# Patient Record
Sex: Female | Born: 1955
Health system: Southern US, Community
[De-identification: ages and names within clinical notes are randomized; demographics above are authoritative.]

## PROBLEM LIST (undated history)

## (undated) DIAGNOSIS — I341 Nonrheumatic mitral (valve) prolapse: Secondary | ICD-10-CM

## (undated) DIAGNOSIS — S6291XA Unspecified fracture of right wrist and hand, initial encounter for closed fracture: Secondary | ICD-10-CM

## (undated) DIAGNOSIS — K219 Gastro-esophageal reflux disease without esophagitis: Secondary | ICD-10-CM

## (undated) DIAGNOSIS — E538 Deficiency of other specified B group vitamins: Secondary | ICD-10-CM

## (undated) DIAGNOSIS — R0600 Dyspnea, unspecified: Secondary | ICD-10-CM

## (undated) DIAGNOSIS — A1801 Tuberculosis of spine: Secondary | ICD-10-CM

## (undated) DIAGNOSIS — G43909 Migraine, unspecified, not intractable, without status migrainosus: Secondary | ICD-10-CM

## (undated) DIAGNOSIS — F32A Depression, unspecified: Secondary | ICD-10-CM

## (undated) DIAGNOSIS — K589 Irritable bowel syndrome without diarrhea: Secondary | ICD-10-CM

## (undated) DIAGNOSIS — T8859XA Other complications of anesthesia, initial encounter: Secondary | ICD-10-CM

## (undated) DIAGNOSIS — I340 Nonrheumatic mitral (valve) insufficiency: Secondary | ICD-10-CM

## (undated) DIAGNOSIS — T4145XA Adverse effect of unspecified anesthetic, initial encounter: Secondary | ICD-10-CM

## (undated) DIAGNOSIS — Z973 Presence of spectacles and contact lenses: Secondary | ICD-10-CM

## (undated) DIAGNOSIS — I471 Supraventricular tachycardia, unspecified: Secondary | ICD-10-CM

## (undated) DIAGNOSIS — I951 Orthostatic hypotension: Secondary | ICD-10-CM

## (undated) DIAGNOSIS — G839 Paralytic syndrome, unspecified: Secondary | ICD-10-CM

## (undated) DIAGNOSIS — F329 Major depressive disorder, single episode, unspecified: Secondary | ICD-10-CM

## (undated) DIAGNOSIS — R5383 Other fatigue: Secondary | ICD-10-CM

## (undated) HISTORY — DX: Dyspnea, unspecified: R06.00

## (undated) HISTORY — DX: Gastro-esophageal reflux disease without esophagitis: K21.9

## (undated) HISTORY — DX: Nonrheumatic mitral (valve) prolapse: I34.1

## (undated) HISTORY — DX: Orthostatic hypotension: I95.1

## (undated) HISTORY — DX: Depression, unspecified: F32.A

## (undated) HISTORY — DX: Tuberculosis of spine: A18.01

## (undated) HISTORY — DX: Deficiency of other specified B group vitamins: E53.8

## (undated) HISTORY — DX: Nonrheumatic mitral (valve) insufficiency: I34.0

## (undated) HISTORY — DX: Other fatigue: R53.83

## (undated) HISTORY — DX: Supraventricular tachycardia: I47.1

## (undated) HISTORY — PX: COLONOSCOPY: SHX174

## (undated) HISTORY — PX: TUBAL LIGATION: SHX77

## (undated) HISTORY — DX: Irritable bowel syndrome, unspecified: K58.9

## (undated) HISTORY — DX: Paralytic syndrome, unspecified: G83.9

## (undated) HISTORY — DX: Supraventricular tachycardia, unspecified: I47.10

## (undated) HISTORY — DX: Major depressive disorder, single episode, unspecified: F32.9

## (undated) HISTORY — DX: Migraine, unspecified, not intractable, without status migrainosus: G43.909

## (undated) HISTORY — DX: Unspecified fracture of right wrist and hand, initial encounter for closed fracture: S62.91XA

---

## 1978-07-21 HISTORY — PX: OTHER SURGICAL HISTORY: SHX169

## 1983-07-22 HISTORY — PX: OTHER SURGICAL HISTORY: SHX169

## 1998-09-14 ENCOUNTER — Ambulatory Visit (HOSPITAL_COMMUNITY): Admission: RE | Admit: 1998-09-14 | Discharge: 1998-09-14 | Payer: Self-pay | Admitting: Internal Medicine

## 1998-12-09 ENCOUNTER — Emergency Department (HOSPITAL_COMMUNITY): Admission: EM | Admit: 1998-12-09 | Discharge: 1998-12-09 | Payer: Self-pay | Admitting: Emergency Medicine

## 1998-12-10 ENCOUNTER — Encounter: Payer: Self-pay | Admitting: Family Medicine

## 1998-12-10 ENCOUNTER — Ambulatory Visit (HOSPITAL_COMMUNITY): Admission: RE | Admit: 1998-12-10 | Discharge: 1998-12-10 | Payer: Self-pay | Admitting: Emergency Medicine

## 2000-02-15 ENCOUNTER — Inpatient Hospital Stay (HOSPITAL_COMMUNITY): Admission: EM | Admit: 2000-02-15 | Discharge: 2000-02-16 | Payer: Self-pay | Admitting: Emergency Medicine

## 2000-02-15 ENCOUNTER — Encounter: Payer: Self-pay | Admitting: Emergency Medicine

## 2000-03-10 ENCOUNTER — Emergency Department (HOSPITAL_COMMUNITY): Admission: EM | Admit: 2000-03-10 | Discharge: 2000-03-10 | Payer: Self-pay | Admitting: Emergency Medicine

## 2000-03-18 ENCOUNTER — Emergency Department (HOSPITAL_COMMUNITY): Admission: EM | Admit: 2000-03-18 | Discharge: 2000-03-18 | Payer: Self-pay | Admitting: Podiatry

## 2000-03-18 ENCOUNTER — Encounter: Payer: Self-pay | Admitting: *Deleted

## 2000-04-10 ENCOUNTER — Ambulatory Visit (HOSPITAL_COMMUNITY): Admission: RE | Admit: 2000-04-10 | Discharge: 2000-04-11 | Payer: Self-pay | Admitting: Internal Medicine

## 2001-02-17 ENCOUNTER — Encounter: Payer: Self-pay | Admitting: Neurology

## 2001-02-17 ENCOUNTER — Emergency Department (HOSPITAL_COMMUNITY): Admission: EM | Admit: 2001-02-17 | Discharge: 2001-02-17 | Payer: Self-pay | Admitting: Emergency Medicine

## 2001-03-17 ENCOUNTER — Encounter: Admission: RE | Admit: 2001-03-17 | Discharge: 2001-06-15 | Payer: Self-pay | Admitting: Family Medicine

## 2002-03-14 ENCOUNTER — Other Ambulatory Visit: Admission: RE | Admit: 2002-03-14 | Discharge: 2002-03-14 | Payer: Self-pay | Admitting: Gynecology

## 2003-03-22 ENCOUNTER — Other Ambulatory Visit: Admission: RE | Admit: 2003-03-22 | Discharge: 2003-03-22 | Payer: Self-pay | Admitting: Gynecology

## 2003-06-28 ENCOUNTER — Ambulatory Visit (HOSPITAL_COMMUNITY): Admission: RE | Admit: 2003-06-28 | Discharge: 2003-06-28 | Payer: Self-pay | Admitting: Gastroenterology

## 2003-06-28 ENCOUNTER — Encounter (INDEPENDENT_AMBULATORY_CARE_PROVIDER_SITE_OTHER): Payer: Self-pay | Admitting: *Deleted

## 2003-08-08 ENCOUNTER — Encounter: Admission: RE | Admit: 2003-08-08 | Discharge: 2003-09-26 | Payer: Self-pay | Admitting: Family Medicine

## 2004-04-18 ENCOUNTER — Other Ambulatory Visit: Admission: RE | Admit: 2004-04-18 | Discharge: 2004-04-18 | Payer: Self-pay | Admitting: Gynecology

## 2004-05-22 ENCOUNTER — Ambulatory Visit: Payer: Self-pay | Admitting: Internal Medicine

## 2004-05-28 ENCOUNTER — Ambulatory Visit: Payer: Self-pay | Admitting: Cardiology

## 2004-06-05 ENCOUNTER — Ambulatory Visit: Payer: Self-pay | Admitting: Internal Medicine

## 2004-06-10 ENCOUNTER — Ambulatory Visit: Payer: Self-pay | Admitting: Internal Medicine

## 2004-06-11 ENCOUNTER — Ambulatory Visit: Payer: Self-pay | Admitting: Internal Medicine

## 2004-06-19 ENCOUNTER — Ambulatory Visit: Payer: Self-pay | Admitting: Internal Medicine

## 2004-06-25 ENCOUNTER — Ambulatory Visit: Payer: Self-pay | Admitting: Cardiology

## 2004-07-03 ENCOUNTER — Ambulatory Visit: Payer: Self-pay | Admitting: Cardiology

## 2004-07-08 ENCOUNTER — Ambulatory Visit: Payer: Self-pay | Admitting: Internal Medicine

## 2004-07-09 ENCOUNTER — Ambulatory Visit: Payer: Self-pay | Admitting: Internal Medicine

## 2004-07-16 ENCOUNTER — Ambulatory Visit: Payer: Self-pay | Admitting: Cardiology

## 2004-07-23 ENCOUNTER — Ambulatory Visit: Payer: Self-pay | Admitting: *Deleted

## 2004-07-29 ENCOUNTER — Ambulatory Visit: Payer: Self-pay | Admitting: Internal Medicine

## 2004-07-30 ENCOUNTER — Ambulatory Visit: Payer: Self-pay | Admitting: Cardiology

## 2004-08-06 ENCOUNTER — Ambulatory Visit: Payer: Self-pay | Admitting: Internal Medicine

## 2004-08-06 ENCOUNTER — Encounter: Admission: RE | Admit: 2004-08-06 | Discharge: 2004-11-04 | Payer: Self-pay | Admitting: Psychiatry

## 2004-08-13 ENCOUNTER — Ambulatory Visit: Payer: Self-pay | Admitting: Cardiology

## 2004-08-21 ENCOUNTER — Ambulatory Visit: Payer: Self-pay | Admitting: Cardiology

## 2004-08-26 ENCOUNTER — Ambulatory Visit: Payer: Self-pay | Admitting: Internal Medicine

## 2004-08-27 ENCOUNTER — Ambulatory Visit: Payer: Self-pay | Admitting: Internal Medicine

## 2004-09-03 ENCOUNTER — Ambulatory Visit: Payer: Self-pay | Admitting: Internal Medicine

## 2004-09-11 ENCOUNTER — Ambulatory Visit: Payer: Self-pay | Admitting: Cardiology

## 2004-09-18 ENCOUNTER — Ambulatory Visit: Payer: Self-pay | Admitting: Internal Medicine

## 2004-09-24 ENCOUNTER — Ambulatory Visit: Payer: Self-pay | Admitting: Internal Medicine

## 2004-10-02 ENCOUNTER — Ambulatory Visit: Payer: Self-pay | Admitting: Internal Medicine

## 2004-10-07 ENCOUNTER — Ambulatory Visit: Payer: Self-pay | Admitting: Internal Medicine

## 2004-10-14 ENCOUNTER — Ambulatory Visit: Payer: Self-pay | Admitting: Internal Medicine

## 2004-10-28 ENCOUNTER — Ambulatory Visit: Payer: Self-pay | Admitting: Internal Medicine

## 2004-10-29 ENCOUNTER — Ambulatory Visit: Payer: Self-pay | Admitting: Internal Medicine

## 2004-11-04 ENCOUNTER — Ambulatory Visit: Payer: Self-pay | Admitting: Internal Medicine

## 2004-11-05 ENCOUNTER — Ambulatory Visit: Payer: Self-pay | Admitting: Cardiology

## 2004-11-12 ENCOUNTER — Ambulatory Visit: Payer: Self-pay | Admitting: Cardiology

## 2004-11-18 ENCOUNTER — Ambulatory Visit: Payer: Self-pay | Admitting: Internal Medicine

## 2004-11-20 ENCOUNTER — Ambulatory Visit: Payer: Self-pay | Admitting: Internal Medicine

## 2004-12-11 ENCOUNTER — Ambulatory Visit: Payer: Self-pay | Admitting: Internal Medicine

## 2004-12-17 ENCOUNTER — Ambulatory Visit: Payer: Self-pay | Admitting: Internal Medicine

## 2004-12-18 ENCOUNTER — Ambulatory Visit: Payer: Self-pay | Admitting: Cardiology

## 2004-12-24 ENCOUNTER — Ambulatory Visit: Payer: Self-pay

## 2005-01-01 ENCOUNTER — Ambulatory Visit: Payer: Self-pay

## 2005-01-08 ENCOUNTER — Ambulatory Visit: Payer: Self-pay

## 2005-01-13 ENCOUNTER — Ambulatory Visit: Payer: Self-pay

## 2005-01-27 ENCOUNTER — Ambulatory Visit: Payer: Self-pay | Admitting: Internal Medicine

## 2005-01-29 ENCOUNTER — Ambulatory Visit: Payer: Self-pay | Admitting: Cardiology

## 2005-02-05 ENCOUNTER — Ambulatory Visit: Payer: Self-pay | Admitting: Cardiology

## 2005-02-18 ENCOUNTER — Ambulatory Visit: Payer: Self-pay | Admitting: Internal Medicine

## 2005-02-19 ENCOUNTER — Ambulatory Visit: Payer: Self-pay | Admitting: *Deleted

## 2005-02-25 ENCOUNTER — Ambulatory Visit: Payer: Self-pay | Admitting: Cardiology

## 2005-03-03 ENCOUNTER — Ambulatory Visit: Payer: Self-pay | Admitting: Internal Medicine

## 2005-03-05 ENCOUNTER — Ambulatory Visit: Payer: Self-pay | Admitting: Internal Medicine

## 2005-03-11 ENCOUNTER — Ambulatory Visit: Payer: Self-pay | Admitting: Cardiology

## 2005-03-19 ENCOUNTER — Ambulatory Visit: Payer: Self-pay | Admitting: *Deleted

## 2005-03-25 ENCOUNTER — Ambulatory Visit: Payer: Self-pay | Admitting: Internal Medicine

## 2005-03-31 ENCOUNTER — Ambulatory Visit: Payer: Self-pay | Admitting: Internal Medicine

## 2005-04-09 ENCOUNTER — Ambulatory Visit: Payer: Self-pay | Admitting: Internal Medicine

## 2005-04-16 ENCOUNTER — Ambulatory Visit: Payer: Self-pay | Admitting: Internal Medicine

## 2005-04-23 ENCOUNTER — Ambulatory Visit: Payer: Self-pay | Admitting: Internal Medicine

## 2005-04-28 ENCOUNTER — Ambulatory Visit: Payer: Self-pay | Admitting: Internal Medicine

## 2005-04-29 ENCOUNTER — Ambulatory Visit: Payer: Self-pay | Admitting: Internal Medicine

## 2005-05-05 ENCOUNTER — Ambulatory Visit: Payer: Self-pay | Admitting: Internal Medicine

## 2005-05-06 ENCOUNTER — Ambulatory Visit: Payer: Self-pay | Admitting: Internal Medicine

## 2005-05-14 ENCOUNTER — Ambulatory Visit: Payer: Self-pay | Admitting: Internal Medicine

## 2005-05-20 ENCOUNTER — Ambulatory Visit: Payer: Self-pay | Admitting: Internal Medicine

## 2005-05-27 ENCOUNTER — Ambulatory Visit: Payer: Self-pay | Admitting: Internal Medicine

## 2005-06-02 ENCOUNTER — Ambulatory Visit: Payer: Self-pay | Admitting: Internal Medicine

## 2005-06-16 ENCOUNTER — Ambulatory Visit: Payer: Self-pay | Admitting: Internal Medicine

## 2005-06-23 ENCOUNTER — Ambulatory Visit: Payer: Self-pay | Admitting: Internal Medicine

## 2005-06-25 ENCOUNTER — Ambulatory Visit: Payer: Self-pay | Admitting: Internal Medicine

## 2005-07-02 ENCOUNTER — Ambulatory Visit: Payer: Self-pay | Admitting: Cardiology

## 2005-07-07 ENCOUNTER — Ambulatory Visit: Payer: Self-pay | Admitting: Internal Medicine

## 2005-07-22 ENCOUNTER — Ambulatory Visit: Payer: Self-pay | Admitting: Internal Medicine

## 2005-08-04 ENCOUNTER — Ambulatory Visit: Payer: Self-pay | Admitting: Internal Medicine

## 2005-08-13 ENCOUNTER — Ambulatory Visit: Payer: Self-pay | Admitting: Cardiology

## 2005-08-20 ENCOUNTER — Ambulatory Visit (HOSPITAL_COMMUNITY): Admission: RE | Admit: 2005-08-20 | Discharge: 2005-08-20 | Payer: Self-pay | Admitting: Internal Medicine

## 2005-08-20 ENCOUNTER — Ambulatory Visit: Payer: Self-pay | Admitting: Cardiology

## 2005-08-26 ENCOUNTER — Ambulatory Visit: Payer: Self-pay | Admitting: Internal Medicine

## 2005-09-08 ENCOUNTER — Ambulatory Visit: Payer: Self-pay | Admitting: Internal Medicine

## 2005-09-09 ENCOUNTER — Ambulatory Visit: Payer: Self-pay | Admitting: Cardiovascular Disease

## 2005-09-30 ENCOUNTER — Ambulatory Visit: Payer: Self-pay | Admitting: Internal Medicine

## 2005-10-14 ENCOUNTER — Ambulatory Visit: Payer: Self-pay | Admitting: Internal Medicine

## 2005-10-22 ENCOUNTER — Ambulatory Visit: Payer: Self-pay | Admitting: Cardiology

## 2005-10-28 ENCOUNTER — Ambulatory Visit: Payer: Self-pay | Admitting: Internal Medicine

## 2005-11-05 ENCOUNTER — Ambulatory Visit: Payer: Self-pay | Admitting: Internal Medicine

## 2005-11-12 ENCOUNTER — Ambulatory Visit: Payer: Self-pay | Admitting: Internal Medicine

## 2005-12-31 ENCOUNTER — Ambulatory Visit: Payer: Self-pay | Admitting: Internal Medicine

## 2006-02-25 ENCOUNTER — Ambulatory Visit: Payer: Self-pay | Admitting: Internal Medicine

## 2006-02-26 ENCOUNTER — Ambulatory Visit: Payer: Self-pay | Admitting: Cardiology

## 2006-04-27 ENCOUNTER — Other Ambulatory Visit: Admission: RE | Admit: 2006-04-27 | Discharge: 2006-04-27 | Payer: Self-pay | Admitting: Gynecology

## 2006-08-05 ENCOUNTER — Encounter: Payer: Self-pay | Admitting: Cardiology

## 2006-08-05 ENCOUNTER — Ambulatory Visit: Payer: Self-pay

## 2006-08-20 ENCOUNTER — Ambulatory Visit: Payer: Self-pay | Admitting: Internal Medicine

## 2006-08-26 ENCOUNTER — Ambulatory Visit: Payer: Self-pay

## 2006-08-27 ENCOUNTER — Ambulatory Visit: Payer: Self-pay | Admitting: Internal Medicine

## 2006-10-08 ENCOUNTER — Encounter: Payer: Self-pay | Admitting: Cardiology

## 2006-10-08 ENCOUNTER — Ambulatory Visit: Payer: Self-pay | Admitting: Cardiology

## 2006-10-08 ENCOUNTER — Ambulatory Visit (HOSPITAL_COMMUNITY): Admission: RE | Admit: 2006-10-08 | Discharge: 2006-10-08 | Payer: Self-pay | Admitting: Internal Medicine

## 2006-11-25 ENCOUNTER — Ambulatory Visit: Payer: Self-pay | Admitting: Internal Medicine

## 2006-11-25 ENCOUNTER — Inpatient Hospital Stay (HOSPITAL_BASED_OUTPATIENT_CLINIC_OR_DEPARTMENT_OTHER): Admission: RE | Admit: 2006-11-25 | Discharge: 2006-11-25 | Payer: Self-pay | Admitting: Internal Medicine

## 2006-11-25 HISTORY — PX: CARDIAC CATHETERIZATION: SHX172

## 2007-04-22 ENCOUNTER — Ambulatory Visit: Payer: Self-pay | Admitting: Emergency Medicine

## 2007-04-22 LAB — CONVERTED CEMR LAB
Anti Nuclear Antibody(ANA): NEGATIVE
Neutrophil cytoplasmic antibodies,IgG,serum: <1:20 {titer}
Rheumatoid fact SerPl-aCnc: <20 intl units/mL — ABNORMAL LOW (ref 0.0–20.0)
Sed Rate: 5 mm/hr (ref 0–25)
ds DNA Ab: 4 (ref <5)

## 2007-04-29 ENCOUNTER — Ambulatory Visit: Admission: RE | Admit: 2007-04-29 | Discharge: 2007-04-29 | Payer: Self-pay | Admitting: Emergency Medicine

## 2007-06-04 DIAGNOSIS — R5383 Other fatigue: Secondary | ICD-10-CM

## 2007-06-04 DIAGNOSIS — I080 Rheumatic disorders of both mitral and aortic valves: Secondary | ICD-10-CM

## 2007-06-04 DIAGNOSIS — G43909 Migraine, unspecified, not intractable, without status migrainosus: Secondary | ICD-10-CM | POA: Insufficient documentation

## 2007-06-04 DIAGNOSIS — R5381 Other malaise: Secondary | ICD-10-CM

## 2007-06-04 DIAGNOSIS — R0602 Shortness of breath: Secondary | ICD-10-CM

## 2007-07-01 ENCOUNTER — Ambulatory Visit: Payer: Self-pay | Admitting: Emergency Medicine

## 2007-12-14 ENCOUNTER — Other Ambulatory Visit: Admission: RE | Admit: 2007-12-14 | Discharge: 2007-12-14 | Payer: Self-pay | Admitting: Gynecology

## 2008-07-21 ENCOUNTER — Encounter: Admission: RE | Admit: 2008-07-21 | Discharge: 2009-07-19 | Payer: Self-pay | Admitting: Family Medicine

## 2008-10-06 ENCOUNTER — Encounter: Payer: Self-pay | Admitting: Internal Medicine

## 2008-10-06 ENCOUNTER — Ambulatory Visit: Payer: Self-pay | Admitting: Internal Medicine

## 2009-04-09 ENCOUNTER — Ambulatory Visit: Payer: Self-pay | Admitting: Internal Medicine

## 2009-04-30 ENCOUNTER — Ambulatory Visit: Payer: Self-pay

## 2009-04-30 ENCOUNTER — Encounter: Payer: Self-pay | Admitting: Internal Medicine

## 2009-04-30 ENCOUNTER — Ambulatory Visit (HOSPITAL_COMMUNITY): Admission: RE | Admit: 2009-04-30 | Discharge: 2009-04-30 | Payer: Self-pay | Admitting: Internal Medicine

## 2009-04-30 ENCOUNTER — Ambulatory Visit: Payer: Self-pay | Admitting: Internal Medicine

## 2009-05-17 ENCOUNTER — Encounter: Admission: RE | Admit: 2009-05-17 | Discharge: 2009-05-17 | Payer: Self-pay | Admitting: Family Medicine

## 2009-05-18 ENCOUNTER — Telehealth: Payer: Self-pay | Admitting: Internal Medicine

## 2009-06-18 ENCOUNTER — Encounter: Admission: RE | Admit: 2009-06-18 | Discharge: 2009-07-18 | Payer: Self-pay | Admitting: Family Medicine

## 2009-06-20 ENCOUNTER — Encounter: Admission: RE | Admit: 2009-06-20 | Discharge: 2009-08-28 | Payer: Self-pay | Admitting: Family Medicine

## 2009-07-30 ENCOUNTER — Encounter: Admission: RE | Admit: 2009-07-30 | Discharge: 2009-08-29 | Payer: Self-pay | Admitting: Family Medicine

## 2010-05-30 ENCOUNTER — Ambulatory Visit: Payer: Self-pay | Admitting: Internal Medicine

## 2010-05-30 DIAGNOSIS — Q078 Other specified congenital malformations of nervous system: Secondary | ICD-10-CM

## 2010-05-30 DIAGNOSIS — I959 Hypotension, unspecified: Secondary | ICD-10-CM

## 2010-05-30 DIAGNOSIS — G901 Familial dysautonomia [Riley-Day]: Secondary | ICD-10-CM | POA: Insufficient documentation

## 2010-08-20 NOTE — Assessment & Plan Note (Signed)
Summary: f1y   Visit Type:  1 year follow up  CC:  headache and dizziness.  History of Present Illness: [name] is seen in followup for recurrent spells of lightheadedness and dizziness. She has seen multiple physicians over the years for this with varying diagnoses. Most recently she was at [facility] for evaluation of "spells". Neurology service probably thought that these were all in her head".  About a year ago she  stopped taking a bunch of her medications including topomax, Vicodin Florinef Vicodin, muscle relaxants and half of her Neurontin. Shehas felt much better overall since then.  A couple of weeks ago she had a period where her blood pressure was quite low running in the 80s and she was significantly symptomatic. It turned out she had a UTI.  Her palpitations are much improved.  She has mitral regurgitation; followup echo last year demonstrated only mild MR opposed to previously read as moderate MR  Problems Prior to Update: 1)  Fatigue, Chronic  (ICD-780.79) 2)  Migraine Headache  (ICD-346.90) 3)  Mitral Regurgitation  (ICD-396.3) 4)  Dyspnea  (ICD-786.05)  Current Medications (verified): 1)  Neurontin 800 Mg  Tabs (Gabapentin) .... Take One Tab By Mouth Two Times A Day 2)  Ambien 10 Mg  Tabs (Zolpidem Tartrate) .... Take One Tab By Mouth Once Daily 3)  Propranolol Hcl 40 Mg  Tabs (Propranolol Hcl) .... Take One Tab By Mouth Two Times A Day 4)  Proamatine 10 Mg  Tabs (Midodrine Hcl) .... Take One Tab By Mouth Once Daily 5)  Lorazepam 0.5 Mg  Tabs (Lorazepam) .... Take 1 Tablet By Mouth Once A Day 6)  Prozac 20 Mg Caps (Fluoxetine Hcl) .... Once Daily  Allergies (verified): 1)  ! Erythromycin  Past History:  Past Medical History: Last updated: 10/05/2008 Dyspnea fatigue  Moderate mitral regurgitation.    October 08, 2006:  A 2-D echocardiogram, normal LV function,     no wall motion abnormalities, mild mitral regurgitation, mildly     dilated left atrium.    Questionable history of mitral valve prolapse.  Reported history of supraventricular tachycardia History of orthostatic hypotension Presumed dysautonomia Mild B12 deficiency anemia Irritable bowel syndrome Gastroesophageal reflux disease Depression.  Status post tubal ligation.  Migraine headaches History of temporary paralysis Pott's  Past Surgical History: Last updated: 10/05/2008 fracture of the right hand with surgery  nasal septal surgery  bilateral tubal ligation  Family History: Last updated: 10/05/2008 Mother is alive at age 6 with hypertension.  Father is   alive at age 57 with hypertension, hyperlipidemia and atrial   fibrillation.  She has two sisters who are alive and well.      Social History: Last updated: 10/05/2008 Retired  Married  Tobacco Use - No.  Alcohol Use - no Drug Use - no Regular Exercise - no  Risk Factors: Exercise: no (10/05/2008)  Risk Factors: Smoking Status: never (10/05/2008)  Vital Signs:  Patient profile:   55 year old female Height:      64 inches Weight:      126 pounds BMI:     21.71 Pulse rate:   65 / minute BP sitting:   104 / 60  (left arm) Cuff size:   regular  Vitals Entered By: Caralee Ates CMA (May 30, 2010 4:23 PM)  Physical Exam  General:  The patient was alert and oriented in no acute distress. HEENT Normal.  Neck veins were flat, carotids were brisk.  Lungs were clear.  Heart sounds were regular without murmurs or gallops.  Abdomen was soft with active bowel sounds. There is no clubbing cyanosis or edema. Skin Warm and dry    Impression & Recommendations:  Problem # 1:  MITRAL REGURGITATION (ICD-396.3) Last. echo looked good; repeat the echo next year  Problem # 2:  HYPOTENSION, UNSPECIFIED (ICD-458.9) she continues to have dysautonomic hypotension. Her palpitations have largely resolved. We'll plan to discontinue over time her beta blocker if tolerated. Have also asked her to take her  ProAmatine twice a day instead of a bolus once a day. She can use it somewhat as needed if she has recurrent hypotension  Problem # 3:  DYSAUTONOMIA (ICD-742.8) as above  Patient Instructions: 1)  Your physician has recommended you make the following change in your medication: Propranolol 20mg  twice a day, Midodrine 5mg  twice a day.  2)  Your physician wants you to follow-up in: 1 year  You will receive a reminder letter in the mail two months in advance. If you don't receive a letter, please call our office to schedule the follow-up appointment. Prescriptions: MIDODRINE HCL 5 MG TABS (MIDODRINE HCL) two times a day  #60 x 11   Entered by:   Claris Gladden RN   Authorized by:   Nathen May, MD, [facility]   Signed by:   Claris Gladden RN on 05/30/2010   Method used:   Electronically to        [facility] (retail)       [address]       [place], [place]  [address]       Ph: [phone] or [phone]       Fax: [phone]   RxID:   [id number] PROPRANOLOL HCL 20 MG TABS (PROPRANOLOL HCL) two times a day  #60 x 11   Entered by:   Claris Gladden RN   Authorized by:   Nathen May, MD, [facility]   Signed by:   Claris Gladden RN on 05/30/2010   Method used:   Electronically to        [facility] (retail)       [address]       [place], [place]  [address]       Ph: [phone] or [phone]       Fax: [phone]   RxID:   [id number]

## 2010-10-18 ENCOUNTER — Telehealth: Payer: Self-pay | Admitting: Internal Medicine

## 2010-10-18 ENCOUNTER — Encounter: Payer: Self-pay | Admitting: *Deleted

## 2010-10-18 NOTE — Telephone Encounter (Signed)
Spoke  with pt c/o chest  pain ( heaviness )  yesterday  and a little today notes with rest and activity Also, she has additional complaints of back soreness b/p running low  B/p ranging 88-95/57-63 instructed pt to go to nearest er for evaluation and treatment.Pt verbalized understanding .

## 2010-11-01 ENCOUNTER — Telehealth: Payer: Self-pay | Admitting: Internal Medicine

## 2010-11-01 NOTE — Telephone Encounter (Signed)
PHONE BUSY X1 .Zack Seal

## 2010-11-01 NOTE — Telephone Encounter (Signed)
LMTCB ./CY 

## 2010-11-06 NOTE — Telephone Encounter (Signed)
Spoke with pt this am c/o b/p running low  88-95/56-70 and hr 59-88 has stopped propanolol for 3-4 days and hr cont to run low also c/o fatigue, dizziness and feels like head rush.  Pt aware will discuss with Dr Graciela Husbands.CY

## 2010-11-20 NOTE — Telephone Encounter (Signed)
Would increase slat and water   If already doing this, we can think in tterms of mitodrine or florinef  But will need her to help [place] which of these worked in the past

## 2010-12-03 NOTE — Assessment & Plan Note (Signed)
[facility]                             PULMONARY OFFICE NOTE   NAME:Laton, LUCRESIA SIMIC                        MRN:          [id number]  DATE:04/22/2007                            DOB:          06-22-1956    REASON FOR CONSULTATION:  We are asked by Dr. Sherryl Manges with  cardiology to evaluate Ms. Couper for exertional dyspnea.   HISTORY:  Ms. Herrero is a 55 year old woman with a reported history of  mitral valve prolapse and associated regurgitation as well as autonomic  neuropathy and adrenergic dysfunction, as evaluated by Dr. Marcille Blanco  at the [facility].  She has had longstanding waxing  and waning functional capacity with weakness and exertional dyspnea that  has been extensively evaluated.  Diagnoses that have been considered  include autonomic dysfunction with associated possible myopathy.  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 It  has also been postulated that she could have multiple sclerosis,  although no associated lesions have been found on evaluation by Dr. Sandria Manly  in [place] and also by neurology at [facility].   Her current complaints include lack of energy, malaise, and significant  progressive dyspnea over the last several months.  She also has  occasional diarrhea.  Her dyspnea has been progressive.  It is  occasionally associated with chest discomfort, which she has described  in the past with her mitral valve prolapse.  She has episodes of  orthostatic dizziness when she goes from a sitting to standing position.  She denies any rash.  She has muscle pain in her legs and arms and joint  pain, but she denies any episodes of erythema or proximal joint  tenderness and swelling.  To date, she has not undergone any formal  rheumatological evaluation.   Her cardiac evaluation has been extensive.   This has included  cardiopulmonary exercise testing as well as an exercise right heart  catheterization to get at any contribution of her mitral valve disease  to her dyspnea.  She has also had nuclear stress testing performed that  did not show any signs of ischemia.  She is referred now to evaluate for  possible primary pulmonary factors contributing to her dyspnea.   PAST MEDICAL HISTORY:  1. Autonomic dysfunction and adrenergic dysfunction is identified by      autonomic testing at the [facility] in February,      2006.  She had been followed by Dr. Marcille Blanco.  It was thought      that she did have findings consistent with but not entirely      diagnostic for postural orthostatic tachycardia syndrome.  2. Mitral regurgitation and mitral valve prolapse.  3. Migraine headaches.  4. Chronic fatigue syndrome.  5. Tubal ligation 20 years ago.   ALLERGIES:  ERYTHROMYCIN causes nausea and vomiting.   CURRENT MEDICATIONS:  1. Gabapentin 800 mg t.i.d.  2. Zolpidem 10 mg daily.  3. Propranolol  40 mg t.i.d.  4. Omeprazole 20 mg daily.  5. Mididrin 10 mg q.a.m..  6. Hydrocodone b.i.d.  7. Lorex p.r.n.   SOCIAL HISTORY:  Patient is married.  She is a never-smoker.  She does  not use alcohol.  She is currently disabled.  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  She has two children.   FAMILY HISTORY:  Significant for arrhythmias and coronary disease in her  father and grandfather and for melanoma in her grandmother.   REVIEW OF SYSTEMS:  Significant for dyspnea, both at rest and with  activity.  Cough that is occasionally productive.  Chest discomfort with  activity.  Loss of appetite, weight loss, abdominal discomfort,  diarrhea, sore throat, chronic headaches, ringing in her ears, and joint  stiffness and pain.   PHYSICAL EXAMINATION:  This is a thin woman who is in no distress on  room air.  Her weight is 116 pounds.  Temperature 97.9,  blood pressure  118/82, heart rate 67, SpO2 97% on room air.  HEENT:  Oropharynx is clear.  There is no posterior pharyngeal erythema.  NECK:  Supple without lymphadenopathy or stridor.  LUNGS:  Clear to auscultation bilaterally.  There is no wheezing on  forced expiration.  HEART:  Regular.  There is a very short 2/6 systolic murmur.  ABDOMEN:  Soft, nontender, nondistended.  Positive bowel sounds.  EXTREMITIES:  No clubbing, cyanosis or edema.   DATA:  A chest x-ray performed on April 22, 2007 shows normal heart  shadow, no infiltrates, no effusions.   Cardiopulmonary exercise testing performed on October 08, 2006 showed  moderately reduced functional capacity with an elevated VE/VCO2 slope  consistent with impaired circulatory function, increased dead space  ventilation and inefficient ventilation.  The oxygen uptake efficiency  slope was reduced which also supported ventilatory insufficiency.  She  did not reach her maximum ventilation.  Overall, her study was  consistent with significant cardiac limitation and increased dead space  ventilation.   An exercise right heart catheterization was then performed.  This study  showed a mild increase in her pulmonary arterial pressures and mitral  regurgitation, but the normal cardiac output augmentation with exercise,  which is inconsistent with functionally limiting mitral valve disease.   IMPRESSION:  This is a very complex case of a 55 year old woman with  dyspnea of unclear etiology.  Her cardiopulmonary exercise test does  show some degree of cardiac limitation, but the most striking aspect of  the workup to me is her increase in dead space ventilation.  Given her  normal spirometry and gas exchange on that study, I am suspicious that  she may have respiratory muscle weakness.  This could be correlated with  her history of autonomic dysfunction and/or neuromuscular disease.  She  may have a polymyositis or some other less typical  myopathy or  neuropathy that would explain her respiratory muscle insufficiency.  I  believe my first task should be to rule out primary underlying lung  disease, and I will initiate a workup now.  In the meantime, she may  need a rheumatological evaluation.   PLAN:  1. Full pulmonary function testing with maximal inspiratory and      experience pressures.  2. Consider possible thromboembolic disease, although there is nothing      to suggest a gas exchange abnormality at this time.  3. I will initiate a rheumatological evaluation and check some  autoimmune labs and will consider referring her to rheumatology,      depending on how the rest of her workup evolves.     Leslye Peer, MD  Electronically Signed    RSB/MedQ  DD: 04/23/2007  DT: 04/24/2007  Job #: [id number]   cc:   Duke Salvia, MD, [facility]  Bevelyn Buckles. Bensimhon, MD  Pricilla Riffle, MD, [facility]

## 2010-12-03 NOTE — Assessment & Plan Note (Signed)
[facility]                         ELECTROPHYSIOLOGY OFFICE NOTE   NAME:Prothero, THOMASENA VANDENHEUVEL                        MRN:          [id number]  DATE:10/06/2008                            DOB:          [birth year]    [name] comes in at the request of her daughter with a number of  complaints.  She continues to fall.  She is also having what she  described as lower extremity jerk beginning as twitches, extending to  upper leg, has been associated with spasm of her upper and lower  extremities associated with subclonic activity.  She is also having some  mild atypical chest pain that may be temporally related to the above.  I  should note that she had a Myoview scan years ago that was unrevealing.   She notes that she has had some degree of muscle atrophy even in her  lower leg.   I should mention that she has seen Dr. Anne Hahn in the past and some of  these episodes have been witnessed by him.  We do not have those  records.   CURRENT MEDICATIONS:  1. Gabapentin 800 b.i.d.  2. Felodipine 10 mg.  3. Mevacor 40 t.i.d.  4. Omeprazole 20.  5. Midodrine at  10 a.m.  6. Hydrocodone.  7. Topamax.  8. Propranolol 80 to 120 mg a day.   On examination today, she was dizzy; taken lying at 127/82 with a pulse  of 68.  There were modest orthostatic changes with the pulse going up to  80 with blood pressure falling to a nadir of 99.  Her exam otherwise was  normal for her lung exam, heart exam, and the extremities.   IMPRESSION:  1. Chest pain - atypical with negative Myoview.  2. Modest orthostatic intolerance.  3. Drop attacks/spasms.   She will be seeing Dr. Rickard Patience next week.  We will look forward  to hearing from her as to her thoughts on [name].  At this point, we  will not make any medication changes.     Duke Salvia, MD, [facility]  Electronically Signed    SCK/MedQ  DD: 10/06/2008  DT: 10/07/2008  Job #: [id number]

## 2010-12-06 NOTE — H&P (Signed)
[place]  Patient:    [name], [name]                        MRN: [id number] Adm. Date:  [id number] Disc. Date: [id number] Attending:  Ginette Otto                         History and Physical  DIAGNOSES: 1. Postural orthostatic hypotension syndrome. 2. Mild anemia. 3. History of supraventricular tachycardia and mitral valve prolapse.  HISTORY OF PRESENT ILLNESS:  [name] is a 55 year old white female admitted with unresponsiveness and orthostasis, in her usual state of health until 5:30 p.m. today, noted by her daughter to become unresponsive while she was getting into their hot tub at home.  Was brought to the emergency room at [facility] and was found to be unresponsive initially, treated with Narcan, thiamine, and romazicon IV, given IV fluids.  Became more responsive over the ensuing hours but was unable to sit up without becoming orthostatic. Had a normal CT of the head in the emergency room.  Chest x-ray was normal. Drug screen was negative.  Patient has a history of postural orthostatic tachycardia syndrome, evaluated in the past by Dr. Graciela Husbands, patients cardiologist, including a tilt table test in February 2000.  Patient has usual syncopal episodes - sometimes twice a week, sometimes twice a month -  but only lasts for several minutes, so this was unusual today.  During this episode, she did not lose consciousness but found that she could not move her muscles well.  There was no seizure activity.  Patient had an event monitor earlier this month per Dr. Graciela Husbands and was found to have SVT episodes, and she has been set up in the near future for EPS ablation therapy for that.  PAST MEDICAL HISTORY:  HOSPITALIZATIONS/OPERATIONS:  Normal vaginal delivery childbirth x 2, fracture of the right hand with surgery in the past, nasal septal surgery in the past, bilateral tubal ligation in the past.  ILLNESS:  History of postural  orthostatic tachycardia syndrome, mitral valve prolapse, SVT, and history of migraines.  ALLERGIES:  Intolerance to CODEINE with GI upset.  CURRENT MEDICATIONS: 1. Effexor 150 b.i.d. 2. Lorazepam 0.5 mg, usually takes about one every other day. 3. ProAmatine 5 mg two tablets t.i.d. 4. Ambien 10 mg q.h.s. 5. Neurontin 800 mg q.i.d. 6. Florinef 0.1 mg takes two a day. 7. Multivitamin daily.  SOCIAL HISTORY:  Married.  Husband accompanies her to the ER tonight. Nonsmoker, no alcohol use.  FAMILY HISTORY:  Mother with high blood pressure, father with atrial fibrillation.  REVIEW OF SYSTEMS:  Patient has no history of thyroid dysfunction, diabetes, high blood pressure, respiratory disease, kidney, or liver disease.  Patient had normal EEG by Dr. Sandria Manly many years ago.  Patients LMP was 28 days ago. They usually last about 2 days.  Has not had significant anemia in the past. Echocardiogram last year showed her mitral valve prolapse.  OBJECTIVE:  GENERAL:  Alert and oriented x 3 when I spoke with patient.  VITAL SIGNS:  Blood pressure 148/83 with patient supine.  Pulse 81, temperature 98.3, respiratory rate 20.  HEENT:  Normocephalic, atraumatic.  EOMI, PERRLA, nonicteric.  Posterior pharynx clear.  NECK:  Without lymphadenopathy, no JVD.  RESPIRATORY:  Clear.  HEART:  RRR, normal S1, S2, with grade 2/6 early systolic ejection murmur at left sternal border consistent with her  history of mitral valve prolapse.  ABDOMEN:  Soft, no HSM.  EXTREMITIES:  Without CCE.  NEUROLOGIC:  Cranial nerves 2-12 intact.  No focal sensory or motor changes. DTRs 2+ and symmetric.  LABORATORY DATA:  Sodium 141, potassium low normal at 3.5, glucose 93. Hemoglobin 11.7, hematocrit 35.1 - are mildly low.  Urine drug screen negative.  Urine pregnancy test negative.  ASSESSMENT AND PLAN: 1. Orthostatic hypotension syndrome.  IV normal saline for tonight.  Continue    current medicines, including  Florinef.  If no better in the morning,    will increase Florinef and consult with her cardiologist, Dr. Graciela Husbands.    Patient on the monitor bed tonight. 2. Mild anemia.  Check serum ferratin and reticulocyte count. 3. History of SVT.  Monitor bed as above. DD:  02/15/00 TD:  02/17/00 Job: [id number]

## 2010-12-06 NOTE — Consult Note (Signed)
[place]  Patient:    [name], [name]                        MRN: [id number] Proc. Date: 02/17/01 Adm. Date:  [id number] Attending:  Molpus, John L                          Consultation Report  CHIEF COMPLAINT:  Spell.  HISTORY OF PRESENT ILLNESS:  The patient is a 55 year old woman, with a 15-year history of spells, weakness and unresponsiveness.  She was seen by Dr. Anne Hahn in November of last year and has been seen by specialist at [facility], as well as specialist in [place], [place].  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  She has spells of body weakness and unresponsiveness with tremors without loss of consciousness, several per month.  Sometimes they will cluster of several within a few weeks and then may go several weeks without any.  They may last anywhere from 15 minutes to four hours and she recovers uneventfully.  She is in the middle of a neurologic and rheumatologic work-up for this.  Today she cleaned the pool early in the morning, took and nap and then went to a doctors appointment in the afternoon where she had one of her typical spells.  Through some miscommunication, she was sent to the emergency room instead to Dr. Anne Hahn office.  The EDP noted tremulous movements, slight response to sternal rub and after 30-60 minutes the patient gradually returned to her baseline of responsiveness and is completely normal now.  The only abnormal test that she has had out of many, reportedly was an EMG.  She has had a normal muscle biopsy, normal EEG, normal MRIs multiple times.  REVIEW OF SYSTEMS:  She is sore, tired, tense, and achy.  PAST MEDICAL HISTORY:  Significant for mitral valve prolapse, irritable bowel syndrome, postural orthostatic hypotension, migraine, tubal ligation, depression, and reflux.  MEDICATIONS:  Neurontin, Ambien, lorazepam, Inderal, Florinef, ProAmatine, iron, Epogen, Prevacid, Effexor, and  coenzyme Q.  ALLERGIES:  CODEINE.  SOCIAL HISTORY:  She is married, disabled on the basis of this, as yet undiagnosed ______.  No cigarette or alcohol use.  FAMILY HISTORY:  Positive for stroke, coronary artery disease and migraine.  PHYSICAL EXAMINATION:  VITAL SIGNS:  On examination, vital signs, temperature is 97.5, pulse 84, respiration 24, blood pressure 153/98, somewhat elevated.  Of course she is lying down is on ProAmatine and Florinef.  HEENT:  Head is normocephalic, atraumatic.  NECK:  Supple without bruits.  HEART:  Regular rate and rhythm.  LUNGS:  Clear to auscultation.  EXTREMITIES:  Without edema.  NEUROLOGICAL:  She is awake and alert, fully oriented with normal language and cognition.  She can repeat, follow commands, name objects.  Totally fluent and normal spontaneous speech.  Cranial nerves:  Pupils are equal and reactive. Discs margins are sharp.  Visual fields are full to confrontation. Extraocular movements intact.  Facial sensation is normal.  Facial motor activity is normal.  Hearing is intact.  Palate is symmetric and tongue is midline.  On motor examination, she has normal bulk, tone and strength throughout, a little bit of give-way weakness but fairly minimal.  Really no obvious tremors at this time.  Reflexes are 2-3+ and symmetric.  Downgoing toes.  Finger-to-nose, heel-to-shin, rapid alternating movements are normal.  Sensory examination is normal.  CT scan of the  brain was normal.  Drug screen was positive for benzodiazepine reflecting her lorazepam.  It was also positive for phencyclidine which I believe is probably a false-positive.  ECG shows normal sinus rhythm and was a normal ECG.  I-STAT showed sodium of 144, potassium 3.6, chloride 1-5, CO2 32, BUN less than 3, glucose of 81.  Hematocrit 45, hemoglobin 15.  IMPRESSION:  Episodic spells of weakness and unresponsiveness for 15 years as part of a larger unknown systemic process.  No  any test reportedly abnormal among many so far as an EMG.  She has a completely normal neurologic examination now.  She is in the middle of a work-up as an outpatient.  This spell is typical among all others.  She feels fine now.  RECOMMENDATIONS:  Will discharge home.  Followup with Dr. Anne Hahn in office soon. DD:  02/17/01 TD:  02/18/01 Job: [id number]

## 2010-12-06 NOTE — Cardiovascular Report (Signed)
[name], [name]                 ACCOUNT NO.:  [id number]   MEDICAL RECORD NO.:  [id number]          PATIENT TYPE:  OIB   LOCATION:  1963                         FACILITY:  [facility]   PHYSICIAN:  Bevelyn Buckles. Bensimhon, MDDATE OF BIRTH:  1955/11/08   DATE OF PROCEDURE:  11/25/2006  DATE OF DISCHARGE:  11/25/2006                            CARDIAC CATHETERIZATION   [name] is a very pleasant 55 year old woman with a history of  autonomic dysfunction and related orthostatic hypotension.  She also has  a history of mitral valve prolapse with moderate mitral regurgitation,  migraine headaches, depression.  She has been suffering with progressive  dyspnea on exertion.  She had a full workup with no clear culprit.  She  did undergo standard cardiopulmonary exercise testing which showed a  moderately decreased functional capacity with a peak VO2 about 16 mL/kg  per minute and very high VE/VCO2 slope suggestive of severe cardiac  limitation.  There was a question of functional mitral regurgitation.  However, stress echocardiogram showed no worsening of her mitral  regurgitation by echocardiogram with peak exercise.  I have discussed  her case extensively with Dr. Tenny Craw and Dr. Graciela Husbands and we decided to  proceed with level III cardiopulmonary exercise test using a pulmonary  artery catheter.  This was performed in the outpatient catheterization  laboratory.   DESCRIPTION OF PROCEDURE:  The risks and benefits of the procedure were  explained.  Consent was signed and placed on the chart.  A 7-French  venous sheath was placed in the right internal jugular vein using a  modified Seldinger technique.  A standard Swan-Ganz catheter was used  for the right heart catheterization.  Pressures were measured in supine  position.  The patient then exercised while lying down with saline bags.  After this was completed, the patient was seated on exercise bike.  She  performed an incremental exercise protocol  starting at zero watts and  increasing 20 watts every 3 minutes.  There were no apparent  complications.  The patient did get quite lightheaded at peak exercise.   EXERCISE DATA:  Total exercise time was 10 minutes 40 seconds.  Peak RER  was 1.24.  Peak VO2 was 16 mL per kilogram per minute.  Maximum work  load was 60 watts.  The VE/VCO2 slope was 53.   HEMODYNAMIC RESULTS:  Supine rest:  Right atrial pressure mean of 6.  RV  pressure 25/3.  PA pressure 23/10 with a mean of 15.  Pulmonary  capillary wedge pressure mean of 10 with a V-wave of 15.  Fick cardiac  output was 3.5 L per minute.  Cardiac index was 2.3 L per minute per  meters squared.  Thermodilution was 2.6/1.7.   Lying flat with saline bags, pulmonary artery pressure was 26/12 with a  mean of 19.  Pulmonary capillary wedge pressure was mean of 13 with a V-  wave of 21.   Seated rest:  PA pressure was 10/3 with a mean of 6.  Pulmonary  capillary wedge was 0 with a V-wave of 2.  (Transducer was checked and  leveled and zeroed multiple times.)  Fick cardiac output was 2.3/1.5.  Thermodilution was 2.0/1.3.   Zero watts:  PA pressure 14/2 with a mean of 8.  Pulmonary capillary  wedge was zero with V-wave of 3.  Fick was 5.1/3.3.  Thermodilution was  3.8/2.5.   Twenty watts:  PA was 13/1 with a mean of 7 and pulmonary capillary  wedge was mean of 0 with a V-wave of 3.  Fick was 5.0/3.3 with a thermo  of 3.8/2.5.   Forty watts:  PA pressure was 19/2 with a mean of 11.  Pulmonary  capillary wedge pressure was 3 with a V-wave of 10.  Fick was 6.8/4.4.  Thermodilution was 5.1/3.3.   Sixty watts:  PA was 32/1 with a mean of 15.  Pulmonary capillary wedge  was a mean of 9 with a V-wave of 18.  Fick was 7.4/4.8.  Thermodilution  was 5.7/3.7.   ASSESSMENT:  1. Moderate decreased functional capacity with markedly decreased      ventilatory efficiency.  2. Low resting cardiac output with normal augmentation with exercise.  3.  Peak exercise she does have a mild increase in her PA pressures and      mitral regurgitation reflected by the V-waves in her wedge tracing.   PLAN/DISCUSSION:  At this point it does not look like she has a  significant functional mitral regurgitation as the cause of her  symptoms.  Although her cardiac output is somewhat low at rest, it  augments normally with exercise suggesting normal central hemodynamics.  I remain at a loss to explain her severe ventilatory inefficiency and  reduced functional capacity.  We will discuss with colleagues.  For now,  would continue medical therapy and risk factor modification.      Bevelyn Buckles. Bensimhon, MD     DRB/MEDQ  D:  11/25/2006  T:  11/25/2006  Job:  [id number]   cc:   Pricilla Riffle, MD, [facility]  Duke Salvia, MD, [facility]

## 2010-12-06 NOTE — Assessment & Plan Note (Signed)
[facility]                       [place] CARDIOLOGY OFFICE NOTE   NAME:[name], [name]                        MRN:          [id number]  DATE:08/27/2006                            DOB:          1955/12/08    August 27, 2006   IDENTIFICATION:  Ms. [name] is a 55 year old woman with a history of  reported autonomic dysfunction, also has a history of mitral valve  regurgitation.  She was referred by [name] Salvia, MD, [facility], for  further evaluation.   Note, I do not have the full chart for dictation today.  She has history  of suggestive of dysautonomia, has undergone extensive evaluation and  testing both here and has been seen by [name], Dr. Graciela [name] in [place], [place], and also a neurologist in [place].  No definite  diagnosis has been made.   The patient has been told she has had mitral valve prolapse for a long  time.  She had an echocardiogram  done in January that was read at our  office.  I have actually overseen it several weeks ago.  This showed  normal LV size and diastolic dimension of 40 mm, left atrium was  reported normal though no measurement was noted on the report.  The  mitral valve, there was no comment on prolapse, but there was noted to  be moderate regurgitation that was eccentric.  Regurgitant volume was  calculated at 27 mL, again based on eccentric jet.   On talking to [name] today, over the past three months, she has noted  increased shortness of breath with activity giving out.  She use to be  able to do something and then recover by the next mid day.  Now it takes  her several days to recover.   This overall is progressive.  She also has increased chest pressure that  can come on more with exertion.  Note, she had a Myoview scan that was  just done a couple of days ago.  It was an adenosine Myoview.  There was  no evidence for ischemia or scar.  LVF was 66%.   ALLERGIES:  CODEINE.   MEDICATIONS:  1. Neurontin 800 t.i.d.  2. Florinef 0.1 daily.  3. Proamatine 5 mg q.a.m.?  4. Propranolol 45 t.i.d.  5. Effexor 175 daily.  6. Ambien 10 nightly.  7. Topamax.  8. Carisoprodol.  9. Hydrocodone p.r.n.  10.Lorazepam p.r.n.  11.Lonox p.r.n.  12.Antiemetic p.r.n.   PAST MEDICAL HISTORY:  1. MVP.  2. Irritable bowel.  3. Pott's.  4. Migraine.  5. Status post tubal ligation.  6. History of reflux.  7. Question  history of depression.   SOCIAL HISTORY:  The patient is married, does not smoke or drink.   FAMILY HISTORY:  Positive for CAD and CVA.   REVIEW OF SYSTEMS:  Patient does not sweat much at all, actually just  under her hairline but not other places.  Chronic diarrhea, followed in  GI.  Has had extensive work-up.  Again, I do not have the full chart to  review this.  Otherwise  all systems reviewed, negative to the above  problem except as noted.   PHYSICAL EXAMINATION:  GENERAL APPEARANCE:  The patient is in no acute  distress.  VITAL SIGNS:  Blood pressure laying 112/70 with pulse 68 sitting, 118/70  with pulse 74 slightly dizzy, standing at 0 minutes  118/78 with pulse  80, at two minutes 120/80 with pulse 76, at five minutes 120/80 with  pulse 78.  HEENT:  Normocephalic and atraumatic.  EOMI, PERL.  Throat clear.  NECK:  JVP is normal, no bruits, no thyromegaly.  LUNGS:  Clear.  CARDIOVASCULAR:  Regular rate and rhythm.  S1 and S2.  Positive click.  No S3 or S4.  No audible murmurs.  ABDOMEN:  Benign, no masses, no hepatomegaly.  EXTREMITIES:  There are 2+ dorsalis pedis pulses, no lower extremity  edema.   IMPRESSION:  On examination today, [name] has evidence for a mitral  valve prolapse, I do not hear any regurgitation.  This, again, may be  fairly dynamic.  I question if it worsens with exercise to explain some  of her symptoms, but echocardiogram  goes against this being severe with  chamber sizes being normal, both left ventricular and  left atrial by  report.  I have actually reviewed the echo.  I think she would be more  in the mild to moderate category.  I was not convinced of a distinct  progression.   I have discussed this with the patient again.  If indeed she has  autonomic dysfunction, may explain the chest heaviness and increased  fatigability.  I did some breathing maneuvers with the patient and I  will review with Dr. Berton [name] both inspiratory and expiratory heart  rate variation as well as Valsalva.  I will also review her  echocardiogram  with Dr. Gala [name]. Another option would be to do an  exercise echo to watch mitral regurgitation after peak exercise.  I told  the  patient I would be in touch with her after I have reviewed these on  where to proceed but would hold on further changes for now.     [name] Riffle, MD, [facility]  Electronically Signed    PVR/MedQ  DD: 08/27/2006  DT: 08/28/2006  Job #: [id number]   cc:   [name] Salvia, MD, [facility]

## 2010-12-06 NOTE — Procedures (Signed)
[place]  Patient:    [name], [name]                          MRN: [id number] Proc. Date: 04/10/00 Attending:  Nathen May, M.D., [facility] CC:         [facility]   Procedure Report  PREOPERATIVE DIAGNOSIS:  Ventricular tachycardia.  POSTOPERATIVE DIAGNOSIS:  Atrial tachycardia, irreproducibly inducible.  PROCEDURE:  Invasive electrophysiological study with arrhythmia mapping.  DESCRIPTION OF PROCEDURE:  After the obtaining of informed consent, the patient was brought to the electrophysiology laboratory and placed on the fluoroscopic table in the supine position.  After routine prep and drape, cardiac catheterization was performed under local anesthesia and conscious sedation.  Noninvasive blood pressure monitoring, transcutaneous oxygen saturation monitoring, and end-tidal CO2 monitoring were monitored continuously throughout the procedure.  Following the procedure, the catheters were removed, hemostasis was achieved, and the patient was transferred to the floor in stable condition.  CATHETERS:  A 5 French quadripolar catheter was inserted via the left femoral vein to the AV junction.  A second 5 French quadripolar catheter was inserted via the left femoral vein to the high right atrium.  A third 5 French quadripolar catheter was inserted via the left femoral vein to the right ventricular apex.  A 6 French octapolar catheter was inserted via the right femoral vein to the coronary sinus.  Surface leads, I, aVF, and V1 were monitored continuously throughout the procedure.  Following the insertion of the catheter, the stimulation protocol included single and double atrial extrastimuli with a paced cycle length of 600 and 400 msec, burst atrial pacing, single ventricular extrastimuli at a paced cycle length of 600 and 400 msec.  RESULTS:  SURFACE ELECTROCARDIOGRAM: Rhythm:  Sinus. Cycle length:  652 msec. PR  interval:  157 msec. QRS duration:  94 msec. AT interval:  401 msec. P-wave duration:  122 msec. Bundle branch block:  Absent. Pre-excitation:  Absent.  AV NODAL FUNCTION: AH interval:  71 msec. AV Wenckebach:  300 msec. VA Wenckebach:  320 msec. AV nodal effective refractory period at a paced cycle length of 400 msec. was 210 msec.  _____ AV nodal physiology was identified without inducible echo beats.  HIS-PURKINJE SYSTEM FUNCTION: HV interval:  32 msec. His bundle duration:  12 msec.  ACCESSORY PATHWAY FUNCTION:  No evidence of an accessory pathway was identified.  ARRHYTHMIAS INDUCED: A. Ventricular tachycardia was irregularly induced.  The cycle length    typically was approximately 300 msec. when it was induced with double    atrial extrastimuli at a paced cycle length of 400 msec.  The P-waves were    upright in I and aVF. B. Characteristics of the tachycardia:  During tachycardia there was no    retrograde conduction at cycle length shorter than tachycardia.  With    ventricular stimulation, there was a V-A-A-V response. C. No atrial pre-excitation was seen with ventricular stimulation during His    bundle refractoriness.  MEDICATIONS ADMINISTERED:  Adenosine, isoproterenol, dopamine, and theophylline were administered in the attempts of trying to augment the reproducibility.  None of these effectively allowed reproducible inducibility of the tachycardia.  Given the inability to successfully sustain tachycardia to allow mapping, RF ablation with this system was not undertaken.  Potentially, repeat ablation attempts with the use of an [employer] mapping system might afford a greater likelihood of success. DD:  08/27/00 TD:  08/28/00  Job: [id number]

## 2010-12-06 NOTE — H&P (Signed)
[name], [name]                 ACCOUNT NO.:  [id number]   MEDICAL RECORD NO.:  [id number]          PATIENT TYPE:  OIB   LOCATION:  1963                         FACILITY:  [facility]   PHYSICIAN:  Bevelyn Buckles. Bensimhon, MDDATE OF BIRTH:  1955-12-06   DATE OF ADMISSION:  11/25/2006  DATE OF DISCHARGE:                              HISTORY & PHYSICAL   PRIMARY CARDIOLOGIST:  Dr. Dietrich Pates.   PATIENT PROFILE:  A 55 year old Caucasian female with long history of  dyspnea and progressive fatigue who presents today to the outpatiemt  cath lab for a level 3 stress test.   PROBLEM LIST:  1. Dyspnea and fatigue.      a.     February of 2008:  Negative adenosine Myoview, EF 66%.      b.     October 08, 2006:  CPX testing, normal PFTs and lung function,       ECG without any acute ST-T changes.  Overall showed moderate       decrease in functional capacity due to an appearance of severe       circulatory limitation.  2. Moderate mitral regurgitation.      a.     October 08, 2006:  A 2-D echocardiogram, normal LV function,       no wall motion abnormalities, mild mitral regurgitation, mildly       dilated left atrium.  3. Questionable history of mitral valve prolapse.  4. Reported history of supraventricular tachycardia.  5. History of orthostatic hypotension.  6. Presumed dysautonomia.  7. Mild B12 deficiency anemia.  8. Irritable bowel syndrome.  9. Gastroesophageal reflux disease.  10.Depression.  11.Status post tubal ligation.  12.Migraine headaches.  13.History of temporary paralysis.   HISTORY OF PRESENT ILLNESS:  A 55 year old married Caucasian female with  long history (intermittently over about 25 years and more constant over  the past ten years) of orthostasis, presyncope, syncope with diagnosed  temporary paralysis by neurology along with progressive fatigue,  weakness and dyspnea on exertion.  All symptoms have been worse over the  past ten years.  She has had multiple  evaluations and most recently was  seen by Dr. Tenny Craw following a normal adenosine Myoview in February of  2008 and echocardiogram which revealed moderate mitral regurgitation.  She had a followup echocardiogram in March of 2008 showing no change in  her valvular disease or LV function and after some discussing, she  underwent CPX testing October 08, 2006 which showed a moderate decrease in  functional capacity due to apparent severe circulatory limitation.  To  further evaluate, the decision was made to perform a level 3 stress test  with right heart pressure measurement during exercise.  She presents  today for level 3 CPX testing.   ALLERGIES:  CODEINE.   HOME MEDICATIONS:  1. Neurontin 800 mg t.i.d.  2. Florinef 0.1 mg q.d.  3. Propranolol 40 mg t.i.d.  4. Effexor 150 mg q.d.  5. Ambien 10 mg q.h.s.  6. Topamax 100 mg q.d.  7. Carisoprodol 350 mg q.d.  8. Hydrocodone q.h.s.  9.  Lorazepam p.r.n.  10.Lonox p.r.n. diarrhea.  11.Prilosec 20 mg q.d.  12.Midrin 10 mg q.d.   FAMILY HISTORY:  Mother is alive at age 61 with hypertension.  Father is  alive at age 81 with hypertension, hyperlipidemia and atrial  fibrillation.  She has two sisters who are alive and well.   SOCIAL HISTORY:  She lives in [place] with her husband.  She has not  worked for the past ten years.  She has two grown children, a 22-year-  old daughter and a 62 year old son.  She denies any tobacco, alcohol or  drug use and does not routinely exercise.   REVIEW OF SYSTEMS:  Positive for weight loss which she attributes to a  year history of diarrhea and dehydration.  Positive for migraine  headaches.  Positive for exertional chest pain, shortness of breath,  dyspnea on exertion.  Positive for occasional dysuria and straining  noting that she dehydrates easily.  She has had weakness and fatigue as  per the HPI.  She has occasional numbness in the hands and legs.  She  generally has right-sided joint pain.  She  has a one-year history of  diarrhea.  She is not diabetic.  All other systems reviewed and  negative.   PHYSICAL EXAMINATION:  GENERAL:  Pleasant white female in no acute  distress, awake, alert and oriented x3.  NECK:  No bruits or JVD.  LUNGS:  Respirations are regular and unlabored.  Clear to auscultation.  CARDIAC:  Regular S1-S2.  No S3-S4 or murmurs.  ABDOMEN:  Round, soft, nontender, nondistended.  Bowel sounds present  x4.  EXTREMITIES:  Warm and dry.  Pink.  No clubbing, cyanosis or edema.  Dorsalis pedis, posterior tibial pulses are 2+ and equal bilaterally.   FINDINGS:  None.   ASSESSMENT/PLAN:  Dyspnea/fatigue/moderate mitral regurgitation.  Plan  for level 3 stress testing today with Dr. Gala Romney to further evaluate  potential cause of dyspnea and fatigue and a role of mitral  regurgitation in symptoms.      Nicolasa Ducking, ANP      Bevelyn Buckles. Bensimhon, MD  Electronically Signed    CB/MEDQ  D:  11/25/2006  T:  11/25/2006  Job:  [id number]

## 2010-12-06 NOTE — Discharge Summary (Signed)
[place]  Patient:    [name], [name]                        MRN: [id number] Adm. Date:  [id number] Disc. Date: [id number] Attending:  Ginette Otto                           Discharge Summary  DIAGNOSES: 1. Syncopal episode in patient with history of orthostatic hypotension    syndrome. 2. Mild anemia. 3. History of supraventricular tachycardia.  HISTORY OF PRESENT ILLNESS:  See admit note for details.  HOSPITAL COURSE:  The patient was admitted and begun on IV fluids with normal saline plus 10 mEq of potassium per liter, placed on the monitor which showed no significant ectopy throughout the hospitalization.  Florinef was increased to two tablets a day.  By the next morning she felt improved, was able to sit and stand without orthostatic symptoms or objective findings.  Blood work showed mild anemia with hemoglobin 11.7 but serum ferritin and reticulocyte count were pending at the time of discharge.  The patient was able to ambulate to the bathroom and around the room without problems and felt ready for discharge at that point.  DISCHARGE MEDICATIONS: 1. Effexor 150 mg b.i.d. 2. ProAmatine 5 mg two tablets b.i.d. 3. Ambien 10 mg q.h.s. 4. Neurontin 800 mg four times a day. 5. Florinef 0.1 mg now at two tablets a day at a higher does. 6. Ferrous sulfate over-the-counter one tablet daily with food. 7. Multivitamin daily. 8. Lorazepam 0.5 mg as needed.  DISCHARGE INSTRUCTIONS:  Increase fluids, sedate activity until followup with Dr. Graciela Husbands, her cardiologist and Dr. Tenny Craw at [facility] to be followed up this week.  Call if any new symptoms.  CONDITION ON DISCHARGE:  Improved.  DISPOSITION:  The patient discharged to home. DD:  02/16/00 TD:  02/18/00 Job: [id number]

## 2010-12-06 NOTE — Letter (Signed)
August 20, 2006    Pricilla Riffle, MD, [facility]  [address]  [address]  [place], [place] [address]   RE:  SHONI, QUIJAS  MRN:  [id number]  /  DOB:  [birth year]   Dear Gunnar Fusi:   Thank you very much for agreeing to see Alyze Lauf.  As you will tell  from her history, she has got a very complex past medical history with a  variety of symptoms suggestive of dysautonomia.  She has undergone  extensive evaluations at multiple centers trying but failing to identify  a unifying diagnosis.  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  I should also note that she  has seen Georgana Curio, MD but, at the end of the day, she has no clear  diagnosis.   Because of progressive shortness of breath and exercise intolerance, we  undertook an echo.  This was read by Dr. Diona Browner as showing moderate MR  with an eccentric jet.  This was a significant change from the echo read  in June 2005 which had identified mild MR.  The question for you is what  is the status of her mitral regurgitation and does she potentially need  a transesophageal echo to clarify it.   In addition, she has had significant exercise-associated chest  discomfort with exertion that she describes as a tight band around her  chest.  Because of that, I have scheduled her to have a  Myoview scan prior to her seeing you, although I think the likelihood of  this being abnormal is low.   Gunnar Fusi, thanks very much for asking [place] to see her and I look forward to  talking to you about her.    Sincerely,      Duke Salvia, MD, [facility]  Electronically Signed    SCK/MedQ  DD: 08/20/2006  DT: 08/20/2006  Job #: [id number]   CC:    Rickard Patience

## 2010-12-06 NOTE — Op Note (Signed)
NAME:  [name], [name]                           ACCOUNT NO.:  [id number]   MEDICAL RECORD NO.:  [id number]                   PATIENT TYPE:  AMB   LOCATION:  ENDO                                 FACILITY:  [facility]   PHYSICIAN:  Petra Kuba, M.D.                 DATE OF BIRTH:  Mar 02, 1956   DATE OF PROCEDURE:  06/28/2003  DATE OF DISCHARGE:                                 OPERATIVE REPORT   PROCEDURE:  Colonoscopy with biopsy.   ENDOSCOPIST:  Petra Kuba, M.D.   INDICATION:  Long-standing diarrhea.   Consent was signed after risks, benefits, methods, and options were  thoroughly discussed in the office with both the patient and her mother.   MEDICINES USED:  Demerol 100, Versed 10.   DESCRIPTION OF PROCEDURE:  Rectal inspection was pertinent for external  hemorrhoids, small.  Digital exam was negative. Video pediatric adjustable  colonoscope was inserted.  There was some tortuosity and looping.  Once we  were able to get the loops compressed with abdominal pressure rolling her  onto her back, we were easily able to advance it to the cecum.  No  abnormality was seen on insertion.  The cecum was identified by the  appendiceal and the ileocecal valve.  In fact, the scope was inserted a  short ways into the terminal ileum which was normal.  Photo documentation  was obtained. Scattered biopsies of the TI were obtained.  The scope was  slowly withdrawn. The prep was adequate.  There was some liquid stool that  required washing and suctioning.  On slow withdrawal through the colon, no  abnormalities were seen, specifically no signs of polyps, tumors, masses,  colitis, etc.  Random colon biopsies were obtained and put in the second  container.  Anorectal pull through and retroflexion confirms some small  hemorrhoids back in the rectum.  The scope was reinserted a short ways up  the left side of the colon.  Air was suctioned and the scope removed. The  patient tolerated the procedure well.   There was no obvious immediate  complication.   ENDOSCOPIC DIAGNOSES:  1. Internal and external hemorrhoids.  2. Tortuous colon.  3. Otherwise well-nourished to the terminal ileum status post random     biopsies throughout.   PLAN:  Await pathology to rule out microscopic colitis, consider a one-time  upper gastrointestinal/small-bowel follow-through, sprue antibodies, etc.  Trial of Carafate, antispasmodic, or Questran in the future when it worsens.  Follow up p.r.n. or in two months to recheck symptoms and decide any other  workup and plans at that time.                                               Petra Kuba, M.D.  MEM/MEDQ  D:  06/28/2003  T:  06/29/2003  Job:  [id number]   cc:   Dorma Russell  [address]  [place]  [place] [address]  Fax: [phone]   Duke Salvia, M.D.

## 2011-06-08 ENCOUNTER — Other Ambulatory Visit: Payer: Self-pay | Admitting: Internal Medicine

## 2011-06-09 ENCOUNTER — Other Ambulatory Visit: Payer: Self-pay

## 2011-06-11 ENCOUNTER — Other Ambulatory Visit: Payer: Self-pay | Admitting: Internal Medicine

## 2011-08-01 ENCOUNTER — Telehealth: Payer: Self-pay | Admitting: Internal Medicine

## 2011-08-01 NOTE — Telephone Encounter (Signed)
I spoke with the patient. She reports that she has been running low BP's for about the past 2 weeks. She has ranged from 85-90/55-60. Today she was 78/60. She took an extra dose of proamitine this morning and she reports her BP went up to around 132 systolic, but she is gradually trending down. She is also on propranolol 20 mg once daily. Her HR's have been good at around 58-78. She has not felt pre-syncopal with her low bp's, but has experience some occasional lightheadedness and headaches. I explained to her that I would review with Dr. Graciela Husbands, but that he may recommend just to increase her fluid and sodium intake. She is over due for follow up with him. She has been scheduled for 08/19/11. I will call her back after reviewing with Dr. Graciela Husbands.

## 2011-08-01 NOTE — Telephone Encounter (Signed)
I spoke with the patient. She is aware of Dr. Odessa Fleming recommendations to hold her beta blocker. We will follow up with her at the end of the month.

## 2011-08-01 NOTE — Telephone Encounter (Signed)
Agree ith holding beta blocker   Extra proamantine is ok up to 15 mg three times a day

## 2011-08-01 NOTE — Telephone Encounter (Signed)
New Problem:     Patient called in because she is having issues with her low blood pressure and her feet are aching.  She has been taking extra doses of her midodrine (PROAMATINE) 5 MG tablet and was wondering if there was anything else she should be doing.

## 2011-08-19 ENCOUNTER — Ambulatory Visit (INDEPENDENT_AMBULATORY_CARE_PROVIDER_SITE_OTHER): Payer: BC Managed Care – PPO | Admitting: Internal Medicine

## 2011-08-19 ENCOUNTER — Encounter: Payer: Self-pay | Admitting: Internal Medicine

## 2011-08-19 DIAGNOSIS — I080 Rheumatic disorders of both mitral and aortic valves: Secondary | ICD-10-CM

## 2011-08-19 DIAGNOSIS — I959 Hypotension, unspecified: Secondary | ICD-10-CM

## 2011-08-19 DIAGNOSIS — R002 Palpitations: Secondary | ICD-10-CM | POA: Insufficient documentation

## 2011-08-19 DIAGNOSIS — Q078 Other specified congenital malformations of nervous system: Secondary | ICD-10-CM

## 2011-08-19 NOTE — Assessment & Plan Note (Signed)
As above.

## 2011-08-19 NOTE — Patient Instructions (Signed)
Your physician has recommended you make the following change in your medication:  1) change propranolol to as needed only.  Your physician has requested that you have an echocardiogram. Echocardiography is a painless test that uses sound waves to create images of your heart. It provides your doctor with information about the size and shape of your heart and how well your heart's chambers and valves are working. This procedure takes approximately one hour. There are no restrictions for this procedure.  Your physician wants you to follow-up in: 1 year with Dr. Graciela Husbands. You will receive a reminder letter in the mail two months in advance. If you don't receive a letter, please call our office to schedule the follow-up appointment.

## 2011-08-19 NOTE — Assessment & Plan Note (Signed)
The patient has had variably described as mild and moderate mitral regurgitation. It's been a couple of years, we'll undertake a repeat echo to look at this.

## 2011-08-19 NOTE — Assessment & Plan Note (Signed)
She continues to stroke with dizziness and fatigue. Interestingly she notes that her blood pressure is lower on cold days and hot days. Reviewing her medications, Inderal could be contributing and we will discontinue her Inderal. She will use it when necessary for palpitations. Also, in 1% of the population Neurontin is associated with vasodilatation. I don't know whether that would be temperature sensitive are not

## 2011-08-19 NOTE — Progress Notes (Signed)
  HPI  [name] is a 56 y.o. female  seen in followup for recurrent spells of lightheadedness and dizziness. She has seen multiple physicians over the years for this with varying diagnoses. Most recently she was at [facility] for evaluation of "spells". Neurology service probably thought that these were all in her head".  About two years ago she stopped taking a bunch of her medications including topomax, Vicodin Florinef Vicodin, muscle relaxants and half of her Neurontin.  She did pretty well. Interestingly she finds that her symptoms are less prominent in the summer. She recently took a trip to the [place] over Christmas. She had a great week and it has been extremely fatigued since then although her symptoms have been gradually abating over the last couple of weeks. She continues to have orthostatic dizziness. Her palpitations have not been too bad.         No past medical history on file.  History reviewed. No pertinent past surgical history.  Current Outpatient Prescriptions  Medication Sig Dispense Refill  . FLUoxetine (PROZAC) 10 MG tablet Take 10 mg by mouth daily.      Marland Kitchen gabapentin (NEURONTIN) 800 MG tablet Take 800 mg by mouth 2 (two) times daily.       . midodrine (PROAMATINE) 5 MG tablet TAKE 1 TABLET BY MOUTH TWICE DAILY  60 tablet  0  . propranolol (INDERAL) 20 MG tablet TAKE 1 TABLET BY MOUTH TWICE DAILY  60 tablet  0  . zolpidem (AMBIEN) 10 MG tablet         Allergies  Allergen Reactions  . Erythromycin     REACTION: vomiting    Review of Systems negative except from HPI and PMH  Physical Exam BP 114/82  Pulse 63  Ht 5\' 3"  (1.6 m)  Wt 135 lb 1.9 oz (61.29 kg)  BMI 23.94 kg/m2 Well developed and well nourished in no acute distress HENT normal E scleral and icterus clear Neck Supple JVP flat; carotids brisk and full Clear to ausculation Regular rate and rhythm, no murmurs gallops or rub Soft with active bowel sounds No clubbing cyanosis none  Edema Alert and oriented, grossly normal motor and sensory function Skin Warm and Dry  The echocardiogram demonstrates sinus rhythm at 63 Intervals 0.16/0.09/0.43 Axis is 77 Assessment and  Plan

## 2011-08-22 ENCOUNTER — Ambulatory Visit (HOSPITAL_COMMUNITY): Payer: BC Managed Care – PPO | Attending: Internal Medicine

## 2011-08-22 DIAGNOSIS — R5381 Other malaise: Secondary | ICD-10-CM | POA: Insufficient documentation

## 2011-08-22 DIAGNOSIS — I080 Rheumatic disorders of both mitral and aortic valves: Secondary | ICD-10-CM

## 2011-08-22 DIAGNOSIS — R002 Palpitations: Secondary | ICD-10-CM | POA: Insufficient documentation

## 2011-08-22 DIAGNOSIS — R0989 Other specified symptoms and signs involving the circulatory and respiratory systems: Secondary | ICD-10-CM | POA: Insufficient documentation

## 2011-08-22 DIAGNOSIS — R0609 Other forms of dyspnea: Secondary | ICD-10-CM | POA: Insufficient documentation

## 2011-08-22 DIAGNOSIS — I059 Rheumatic mitral valve disease, unspecified: Secondary | ICD-10-CM | POA: Insufficient documentation

## 2011-08-22 DIAGNOSIS — I951 Orthostatic hypotension: Secondary | ICD-10-CM | POA: Insufficient documentation

## 2012-03-09 ENCOUNTER — Ambulatory Visit: Payer: BC Managed Care – PPO | Admitting: Physician Assistant

## 2012-09-01 ENCOUNTER — Encounter: Payer: Self-pay | Admitting: Internal Medicine

## 2012-09-01 ENCOUNTER — Ambulatory Visit (INDEPENDENT_AMBULATORY_CARE_PROVIDER_SITE_OTHER): Payer: BC Managed Care – PPO | Admitting: Internal Medicine

## 2012-09-01 VITALS — BP 115/61 | HR 82 | Ht 64.0 in | Wt 137.2 lb

## 2012-09-01 DIAGNOSIS — R5381 Other malaise: Secondary | ICD-10-CM

## 2012-09-01 DIAGNOSIS — I959 Hypotension, unspecified: Secondary | ICD-10-CM

## 2012-09-01 DIAGNOSIS — R5383 Other fatigue: Secondary | ICD-10-CM

## 2012-09-01 DIAGNOSIS — Q078 Other specified congenital malformations of nervous system: Secondary | ICD-10-CM

## 2012-09-01 DIAGNOSIS — R002 Palpitations: Secondary | ICD-10-CM

## 2012-09-01 NOTE — Assessment & Plan Note (Signed)
This has been underlying problem for some time. It seems to be a significant overall decrease in her symptoms for reasons I can't explain of which I am certainly grateful

## 2012-09-01 NOTE — Assessment & Plan Note (Signed)
Blood pressure is normal today. 

## 2012-09-01 NOTE — Assessment & Plan Note (Signed)
Largely quiescent at this time

## 2012-09-01 NOTE — Progress Notes (Signed)
Patient has no care team.   HPI  [name] is a 57 y.o. female seen in followup for recurrent spells of lightheadedness and dizziness. She has seen multiple physicians over the years for this with varying diagnoses. Most recently she was at [facility] for evaluation of "spells". Neurology service probably thought that these were"all in her head".   About t2011she stopped taking a bunch of her medications including topomax, Vicodin Florinef Vicodin, muscle relaxants and half of her Neurontin.   At this point she is feeling better than she has in years. She has mild lightheadedness. Her energy is better than it has been. 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 Dyspnea   . Fatigue   . Moderate mitral regurgitation   . Mitral valve prolapse     questional Hx  . Supraventricular tachycardia   . Orthostatic hypotension   . Vitamin B12 deficiency     mild  . IBS (irritable bowel syndrome)   . GERD (gastroesophageal reflux disease)   . Depression   . Migraine headache   . Paralysis     temporary  . Tuberculosis of spine (Pott's)   . Right hand fracture     Past Surgical History  Procedure Laterality Date  . Tubal ligation      bilateral  . Right hand surgery    . Nasal septal surgery    . Cardiac catheterization  11/25/2006    Dr. Gala Romney    Current Outpatient Prescriptions  Medication Sig Dispense Refill  . FLUoxetine (PROZAC) 10 MG tablet Take 10 mg by mouth daily.      Marland Kitchen gabapentin (NEURONTIN) 800 MG tablet Take 800 mg by mouth 2 (two) times daily.       . midodrine (PROAMATINE) 5 MG tablet       . propranolol (INDERAL) 20 MG tablet Take 10 mg by mouth as needed. Take one tablet by mouth as needed.    0  . zolpidem (AMBIEN) 10 MG tablet Take 10 mg by mouth at bedtime as needed.        No current facility-administered medications for this visit.    Allergies  Allergen Reactions  . Erythromycin     REACTION:  vomiting    Review of Systems negative except from HPI and PMH  Physical Exam BP 115/61  Pulse 82  Ht 5\' 4"  (1.626 m)  Wt 137 lb 3.2 oz (62.234 kg)  BMI 23.54 kg/m2 Well developed and nourished in no acute distress HENT normal Neck supple with JVP-flat Carotids brisk and full without bruits Clear Regular rate and rhythm, no murmurs or gallops Abd-soft with active BS without hepatomegaly No Clubbing cyanosis edema Skin-warm and dry A & Oriented  Grossly normal sensory and motor function  ECG demonstrates sinus rhythm 68 Intervals 15/09/42 Axis LXX Otherwise normal  Assessment and  Plan

## 2012-09-01 NOTE — Patient Instructions (Signed)
Your physician wants you to follow-up in: 1 year with Dr. Klein. You will receive a reminder letter in the mail two months in advance. If you don't receive a letter, please call our office to schedule the follow-up appointment.  Your physician recommends that you continue on your current medications as directed. Please refer to the Current Medication list given to you today.  

## 2012-09-01 NOTE — Assessment & Plan Note (Signed)
As above.

## 2012-11-19 ENCOUNTER — Telehealth: Payer: Self-pay | Admitting: Internal Medicine

## 2012-11-19 NOTE — Telephone Encounter (Signed)
Spoke with susan, the office notes they were faxed are missing the last part. Printed the office notes and will fax to dr Hyacinth Meeker at [phone].

## 2012-11-19 NOTE — Telephone Encounter (Signed)
New Prob     Has some questions regarding last office note, states she thinks something is missing in note (assessment & planning). Please call.

## 2014-10-18 ENCOUNTER — Emergency Department (HOSPITAL_COMMUNITY): Payer: Commercial Managed Care - HMO

## 2014-10-18 ENCOUNTER — Encounter (HOSPITAL_COMMUNITY): Payer: Self-pay | Admitting: Emergency Medicine

## 2014-10-18 ENCOUNTER — Emergency Department (HOSPITAL_COMMUNITY)
Admission: EM | Admit: 2014-10-18 | Discharge: 2014-10-19 | Disposition: A | Discharge status: Home / Self Care | Payer: Commercial Managed Care - HMO | Attending: Emergency Medicine | Admitting: Emergency Medicine

## 2014-10-18 DIAGNOSIS — Z79899 Other long term (current) drug therapy: Secondary | ICD-10-CM | POA: Diagnosis not present

## 2014-10-18 DIAGNOSIS — S5290XB Unspecified fracture of unspecified forearm, initial encounter for open fracture type I or II: Secondary | ICD-10-CM | POA: Diagnosis not present

## 2014-10-18 DIAGNOSIS — S52502A Unspecified fracture of the lower end of left radius, initial encounter for closed fracture: Secondary | ICD-10-CM | POA: Diagnosis not present

## 2014-10-18 DIAGNOSIS — Z8719 Personal history of other diseases of the digestive system: Secondary | ICD-10-CM | POA: Diagnosis not present

## 2014-10-18 DIAGNOSIS — W010XXA Fall on same level from slipping, tripping and stumbling without subsequent striking against object, initial encounter: Secondary | ICD-10-CM | POA: Insufficient documentation

## 2014-10-18 DIAGNOSIS — Z8669 Personal history of other diseases of the nervous system and sense organs: Secondary | ICD-10-CM | POA: Diagnosis not present

## 2014-10-18 DIAGNOSIS — Z8611 Personal history of tuberculosis: Secondary | ICD-10-CM | POA: Diagnosis not present

## 2014-10-18 DIAGNOSIS — Z8639 Personal history of other endocrine, nutritional and metabolic disease: Secondary | ICD-10-CM | POA: Insufficient documentation

## 2014-10-18 DIAGNOSIS — Y9289 Other specified places as the place of occurrence of the external cause: Secondary | ICD-10-CM | POA: Insufficient documentation

## 2014-10-18 DIAGNOSIS — T148XXA Other injury of unspecified body region, initial encounter: Secondary | ICD-10-CM

## 2014-10-18 DIAGNOSIS — G43909 Migraine, unspecified, not intractable, without status migrainosus: Secondary | ICD-10-CM | POA: Insufficient documentation

## 2014-10-18 DIAGNOSIS — S52591A Other fractures of lower end of right radius, initial encounter for closed fracture: Secondary | ICD-10-CM | POA: Diagnosis not present

## 2014-10-18 DIAGNOSIS — T148 Other injury of unspecified body region: Secondary | ICD-10-CM | POA: Diagnosis not present

## 2014-10-18 DIAGNOSIS — Y9389 Activity, other specified: Secondary | ICD-10-CM | POA: Insufficient documentation

## 2014-10-18 DIAGNOSIS — Y998 Other external cause status: Secondary | ICD-10-CM | POA: Diagnosis not present

## 2014-10-18 DIAGNOSIS — S52352A Displaced comminuted fracture of shaft of radius, left arm, initial encounter for closed fracture: Secondary | ICD-10-CM | POA: Diagnosis not present

## 2014-10-18 DIAGNOSIS — S59911A Unspecified injury of right forearm, initial encounter: Secondary | ICD-10-CM | POA: Diagnosis present

## 2014-10-18 DIAGNOSIS — S52501A Unspecified fracture of the lower end of right radius, initial encounter for closed fracture: Secondary | ICD-10-CM | POA: Diagnosis not present

## 2014-10-18 MED ORDER — FENTANYL CITRATE 0.05 MG/ML IJ SOLN
50.0000 ug | Freq: Once | INTRAMUSCULAR | Status: AC
  Administered 2014-10-18: 50 ug via INTRAVENOUS
  Filled 2014-10-18: qty 2

## 2014-10-18 NOTE — ED Notes (Signed)
Bed: BM84 Expected date:  Expected time:  Means of arrival:  Comments: EMS/fall/deformity left forearm

## 2014-10-18 NOTE — ED Notes (Addendum)
Per EMS: Pt tripped over her dog and fell. Pt has deformity to L forearm. Pt has had 150 mcg Fentanyl.

## 2014-10-19 ENCOUNTER — Emergency Department (HOSPITAL_COMMUNITY): Payer: Commercial Managed Care - HMO

## 2014-10-19 DIAGNOSIS — Z8611 Personal history of tuberculosis: Secondary | ICD-10-CM | POA: Diagnosis not present

## 2014-10-19 DIAGNOSIS — Z79899 Other long term (current) drug therapy: Secondary | ICD-10-CM | POA: Diagnosis not present

## 2014-10-19 DIAGNOSIS — S52501A Unspecified fracture of the lower end of right radius, initial encounter for closed fracture: Secondary | ICD-10-CM | POA: Diagnosis not present

## 2014-10-19 DIAGNOSIS — Z8639 Personal history of other endocrine, nutritional and metabolic disease: Secondary | ICD-10-CM | POA: Diagnosis not present

## 2014-10-19 DIAGNOSIS — Z8719 Personal history of other diseases of the digestive system: Secondary | ICD-10-CM | POA: Diagnosis not present

## 2014-10-19 DIAGNOSIS — S52352A Displaced comminuted fracture of shaft of radius, left arm, initial encounter for closed fracture: Secondary | ICD-10-CM | POA: Diagnosis not present

## 2014-10-19 DIAGNOSIS — Z8669 Personal history of other diseases of the nervous system and sense organs: Secondary | ICD-10-CM | POA: Diagnosis not present

## 2014-10-19 DIAGNOSIS — S52502A Unspecified fracture of the lower end of left radius, initial encounter for closed fracture: Secondary | ICD-10-CM | POA: Diagnosis not present

## 2014-10-19 DIAGNOSIS — G43909 Migraine, unspecified, not intractable, without status migrainosus: Secondary | ICD-10-CM | POA: Diagnosis not present

## 2014-10-19 MED ORDER — MORPHINE SULFATE 4 MG/ML IJ SOLN
4.0000 mg | Freq: Once | INTRAMUSCULAR | Status: AC
  Administered 2014-10-19: 4 mg via INTRAVENOUS
  Filled 2014-10-19: qty 1

## 2014-10-19 MED ORDER — LIDOCAINE HCL 2 % IJ SOLN
INTRAMUSCULAR | Status: AC
  Filled 2014-10-19: qty 20

## 2014-10-19 MED ORDER — HYDROCODONE-ACETAMINOPHEN 5-325 MG PO TABS: 1.0000 | ORAL_TABLET | ORAL | Status: DC | PRN

## 2014-10-19 NOTE — Discharge Instructions (Signed)
Cast or Splint Care °Casts and splints support injured limbs and keep bones from moving while they heal. It is important to care for your cast or splint at home.   °HOME CARE INSTRUCTIONS °· Keep the cast or splint uncovered during the drying period. It can take 24 to 48 hours to dry if it is made of plaster. A fiberglass cast will dry in less than 1 hour. °· Do not rest the cast on anything harder than a pillow for the first 24 hours. °· Do not put weight on your injured limb or apply pressure to the cast until your health care provider gives you permission. °· Keep the cast or splint dry. Wet casts or splints can lose their shape and may not support the limb as well. A wet cast that has lost its shape can also create harmful pressure on your skin when it dries. Also, wet skin can become infected. °· Cover the cast or splint with a plastic bag when bathing or when out in the rain or snow. If the cast is on the trunk of the body, take sponge baths until the cast is removed. °· If your cast does become wet, dry it with a towel or a blow dryer on the cool setting only. °· Keep your cast or splint clean. Soiled casts may be wiped with a moistened cloth. °· Do not place any hard or soft foreign objects under your cast or splint, such as cotton, toilet paper, lotion, or powder. °· Do not try to scratch the skin under the cast with any object. The object could get stuck inside the cast. Also, scratching could lead to an infection. If itching is a problem, use a blow dryer on a cool setting to relieve discomfort. °· Do not trim or cut your cast or remove padding from inside of it. °· Exercise all joints next to the injury that are not immobilized by the cast or splint. For example, if you have a long leg cast, exercise the hip joint and toes. If you have an arm cast or splint, exercise the shoulder, elbow, thumb, and fingers. °· Elevate your injured arm or leg on 1 or 2 pillows for the first 1 to 3 days to decrease  swelling and pain. It is best if you can comfortably elevate your cast so it is higher than your heart. °SEEK MEDICAL CARE IF:  °· Your cast or splint cracks. °· Your cast or splint is too tight or too loose. °· You have unbearable itching inside the cast. °· Your cast becomes wet or develops a soft spot or area. °· You have a bad smell coming from inside your cast. °· You get an object stuck under your cast. °· Your skin around the cast becomes red or raw. °· You have new pain or worsening pain after the cast has been applied. °SEEK IMMEDIATE MEDICAL CARE IF:  °· You have fluid leaking through the cast. °· You are unable to move your fingers or toes. °· You have discolored (blue or white), cool, painful, or very swollen fingers or toes beyond the cast. °· You have tingling or numbness around the injured area. °· You have severe pain or pressure under the cast. °· You have any difficulty with your breathing or have shortness of breath. °· You have chest pain. °Document Released: 07/04/2000 Document Revised: 04/27/2013 Document Reviewed: 01/13/2013 °Exit[employer]® Patient Information ©2015 [employer]. This information is not intended to replace advice given to you by your health care   provider. Make sure you discuss any questions you have with your health care provider. ° °Wrist Fracture °A wrist fracture is a break or crack in one of the bones of your wrist. Your wrist is made up of eight small bones at the palm of your hand (carpal bones) and two long bones that make up your forearm (radius and ulna).  °CAUSES  °· A direct blow to the wrist. °· Falling on an outstretched hand. °· Trauma, such as a car accident or a fall. °RISK FACTORS °Risk factors for wrist fracture include:  °· Participating in contact and high-risk sports, such as skiing, biking, and ice skating. °· Taking steroid medicines. °· Smoking. °· Being female. °· Being Caucasian. °· Drinking more than three alcoholic beverages per day. °· Having low or  lowered bone density (osteoporosis or osteopenia). °· Age. Older adults have decreased bone density. °· Women who have had menopause. °· History of previous fractures. °SIGNS AND SYMPTOMS °Symptoms of wrist fractures include tenderness, bruising, and inflammation. Additionally, the wrist may hang in an odd position or appear deformed.  °DIAGNOSIS °Diagnosis may include: °· Physical exam. °· X-ray. °TREATMENT °Treatment depends on many factors, including the nature and location of the fracture, your age, and your activity level. Treatment for wrist fracture can be nonsurgical or surgical.  °Nonsurgical Treatment °A plaster cast or splint may be applied to your wrist if the bone is in a good position. If the fracture is not in good position, it may be necessary for your health care provider to realign it before applying a splint or cast. Usually, a cast or splint will be worn for several weeks.  °Surgical Treatment °Sometimes the position of the bone is so far out of place that surgery is required to apply a device to hold it together as it heals. Depending on the fracture, there are a number of options for holding the bone in place while it heals, such as a cast and metal pins.   °HOME CARE INSTRUCTIONS °· Keep your injured wrist elevated and move your fingers as much as possible. °· Do not put pressure on any part of your cast or splint. It may break.   °· Use a plastic bag to protect your cast or splint from water while bathing or showering. Do not lower your cast or splint into water. °· Take medicines only as directed by your health care provider. °· Keep your cast or splint clean and dry. If it becomes wet, damaged, or suddenly feels too tight, contact your health care provider right away. °· Do not use any tobacco products including cigarettes, chewing tobacco, or electronic cigarettes. Tobacco can delay bone healing. If you need help quitting, ask your health care provider. °· Keep all follow-up visits as  directed by your health care provider. This is important. °· Ask your health care provider if you should take supplements of calcium and vitamins C and D to promote bone healing. °SEEK MEDICAL CARE IF:  °· Your cast or splint is damaged, breaks, or gets wet. °· You have a fever. °· You have chills. °· You have continued severe pain or more swelling than you did before the cast was put on. °SEEK IMMEDIATE MEDICAL CARE IF:  °· Your hand or fingernails on the injured arm turn blue or gray, or feel cold or numb. °· You have decreased feeling in the fingers of your injured arm. °MAKE SURE YOU: °· Understand these instructions. °· Will watch your condition. °· Will get help   right away if you are not doing well or get worse. °Document Released: 04/16/2005 Document Revised: 11/21/2013 Document Reviewed: 07/25/2011 °ExitCare® Patient Information ©2015 [employer]. This information is not intended to replace advice given to you by your health care provider. Make sure you discuss any questions you have with your health care provider. ° °

## 2014-10-19 NOTE — ED Provider Notes (Signed)
CSN: [id number]     Arrival date & time 10/18/14  2303 History   First MD Initiated Contact with Patient 10/18/14 2305     Chief Complaint  Patient presents with  . Fall  . Arm Injury     (Consider location/radiation/quality/duration/timing/severity/associated sxs/prior Treatment) HPI  Patient reports her phone was ringing and she was rushing to answer the phone and did not turn on the light. She did not realize that her Lucita Lora was lying on the floor and she tripped over him. She states she fell. She denies hitting her head or having loss of consciousness. She complains of pain in her left wrist area. Patient is right-handed. She denies any other injury.  Patient was given fentanyl 150 g by EMS.  PCP Dr Hollace Hayward at [facility] orthopedist Dr Berenice Primas  Past Medical History  Diagnosis Date  . Dyspnea   . Fatigue   . Moderate mitral regurgitation   . Mitral valve prolapse     questional Hx  . Supraventricular tachycardia   . Orthostatic hypotension   . Vitamin B12 deficiency     mild  . IBS (irritable bowel syndrome)   . GERD (gastroesophageal reflux disease)   . Depression   . Migraine headache   . Paralysis     temporary  . Tuberculosis of spine (Pott's)   . Right hand fracture    Past Surgical History  Procedure Laterality Date  . Tubal ligation      bilateral  . Right hand surgery    . Nasal septal surgery    . Cardiac catheterization  11/25/2006    Dr. Haroldine Laws   Family History  Problem Relation Age of Onset  . Hypertension Mother 21  . Hypertension Father 37  . Hyperlipidemia Father   . Atrial fibrillation Father    History  Substance Use Topics  . Smoking status: Never Smoker   . Smokeless tobacco: Not on file  . Alcohol Use: No   On disability  OB History    No data available     Review of Systems  All other systems reviewed and are negative.     Allergies  Erythromycin  Home Medications   Prior to  Admission medications   Medication Sig Start Date End Date Taking? Authorizing Provider  FLUoxetine (PROZAC) 10 MG tablet Take 10 mg by mouth daily.   Yes Historical Provider, MD  gabapentin (NEURONTIN) 800 MG tablet Take 800 mg by mouth 2 (two) times daily.  08/12/11  Yes Historical Provider, MD  propranolol (INDERAL) 20 MG tablet Take 10 mg by mouth as needed (bp).  08/19/11  Yes Deboraha Sprang, MD  zolpidem (AMBIEN) 10 MG tablet Take 10 mg by mouth at bedtime as needed.  07/25/11  Yes Historical Provider, MD   BP 107/56 mmHg  Pulse 82  Temp(Src) 97.5 F (36.4 C)  Resp 18  SpO2 100%  Vital signs normal   Physical Exam  Constitutional: She is oriented to person, place, and time. She appears well-developed and well-nourished.  Non-toxic appearance. She does not appear ill. No distress.  HENT:  Head: Normocephalic and atraumatic.  Right Ear: External ear normal.  Left Ear: External ear normal.  Nose: Nose normal. No mucosal edema or rhinorrhea.  Mouth/Throat: Oropharynx is clear and moist and mucous membranes are normal. No dental abscesses or uvula swelling.  Eyes: Conjunctivae and EOM are normal. Pupils are equal, round, and reactive to light.  Neck: Normal range of motion  and full passive range of motion without pain. Neck supple.  Pulmonary/Chest: Effort normal. No respiratory distress. She has no rhonchi. She exhibits no crepitus.  Abdominal: Normal appearance.  Musculoskeletal: Normal range of motion. She exhibits tenderness.  Moves all extremities well except for her LUE.   I removed patient's rings from her left ring finger. They were given to the patient.  Patient started to have deformity of her left wrist with Colles' type fracture in appearance. She has intact pulses. Her sensation is intact distally. She has minimal movement of her fingers due to pain.  Neurological: She is alert and oriented to person, place, and time. She has normal strength. No cranial nerve deficit.   Skin: Skin is warm, dry and intact. No rash noted. No erythema. No pallor.  Psychiatric: She has a normal mood and affect. Her speech is normal and behavior is normal. Her mood appears not anxious.  Nursing note and vitals reviewed.   ED Course  Procedures (including critical care time)  Medications  fentaNYL (SUBLIMAZE) injection 50 mcg (50 mcg Intravenous Given 10/18/14 2327)  morphine 4 MG/ML injection 4 mg (4 mg Intravenous Given 10/19/14 0033)    00:53 Dr Grandville Silos will look at Xray and call me back  01:00 Dr Grandville Silos will come reduce fracture  Dr Grandville Silos reduced and splinted her fracture.   Labs Review Labs Reviewed - No data to display  Imaging Review Dg Forearm Left  10/19/2014   CLINICAL DATA:  Tripped over dog in bedroom and fell, with obvious left wrist deformity. Initial encounter.  EXAM: LEFT FOREARM - 2 VIEW  COMPARISON:  None.  FINDINGS: There is a comminuted and mildly impacted fracture involving the distal radial metaphysis, with significant dorsal angulation and mild dorsal displacement. The distal ulna appears grossly intact. Surrounding soft tissue swelling is noted.  The elbow joint is incompletely assessed, but appears grossly unremarkable. The carpal rows articulate normally with the displaced distal radial fragments.  IMPRESSION: Comminuted mildly impacted fracture involving the distal radial metaphysis, with significant dorsal angulation and mild dorsal displacement.   Electronically Signed   By: Garald Balding M.D.   On: 10/19/2014 00:22   Dg Wrist 2 Views Left  10/19/2014   CLINICAL DATA:  Status post reduction of distal radius fracture. Initial encounter.  EXAM: LEFT WRIST - 2 VIEW  COMPARISON:  Left wrist radiographs performed 10/18/2014  FINDINGS: The comminuted fracture of the distal radial metaphysis demonstrates improved alignment, with slight residual dorsal displacement and mild angulation of a dorsal cortical fragment. No significant angulation of the  distal fragments is seen.  Soft tissue swelling is noted about the wrist. No new fractures are seen. The carpal rows appear grossly intact.  IMPRESSION: Comminuted fracture of the distal radial metaphysis demonstrates improved alignment, with slight residual dorsal displacement. No significant angulation seen. Mild angulation of a small dorsal cortical fragment.   Electronically Signed   By: Garald Balding M.D.   On: 10/19/2014 02:15   Dg Wrist Complete Left  10/19/2014   CLINICAL DATA:  Tripped over dog in bedroom and fell, with obvious left wrist deformity. Initial encounter.  EXAM: LEFT WRIST - COMPLETE 3+ VIEW  COMPARISON:  None.  FINDINGS: There is a comminuted mildly impacted fracture involving the distal radial metaphysis, with significant dorsal angulation and mild dorsal displacement. Surrounding soft tissue swelling is noted.  The distal ulna appears intact. The carpal rows are grossly unremarkable in appearance, articulating normally with the displaced distal radius fragments.  IMPRESSION: Comminuted mildly impacted fracture involving the distal radial metaphysis, with significant dorsal angulation and mild dorsal displacement.   Electronically Signed   By: Garald Balding M.D.   On: 10/19/2014 00:26     EKG Interpretation None      MDM   Final diagnoses:  Radius distal fracture, right, closed, initial encounter    Discharge Medication List as of 10/19/2014  2:15 AM    START taking these medications   Details  HYDROcodone-acetaminophen (NORCO) 5-325 MG per tablet Take 1-2 tablets by mouth every 4 (four) hours as needed., Starting 10/19/2014, Until Discontinued, Print        Plan discharge  Rolland Porter, MD, Barbette Or, MD 10/19/14 [phone]

## 2014-10-19 NOTE — ED Notes (Signed)
Dr Grandville Silos and ortho tech at bedside

## 2014-10-19 NOTE — Consult Note (Addendum)
ORTHOPAEDIC CONSULTATION HISTORY & PHYSICAL REQUESTING PHYSICIAN: Rolland Porter, MD  Chief Complaint: Left wrist injury  HPI: [name] is a 59 y.o. female who tripped and fell over her dog at home, landing on an outstretched left hand. She had immediate onset of pain, deformity, and swelling. She presented to the emergency department as a temporary splint in place.  Past Medical History  Diagnosis Date  . Dyspnea   . Fatigue   . Moderate mitral regurgitation   . Mitral valve prolapse     questional Hx  . Supraventricular tachycardia   . Orthostatic hypotension   . Vitamin B12 deficiency     mild  . IBS (irritable bowel syndrome)   . GERD (gastroesophageal reflux disease)   . Depression   . Migraine headache   . Paralysis     temporary  . Tuberculosis of spine (Pott's)   . Right hand fracture    Past Surgical History  Procedure Laterality Date  . Tubal ligation      bilateral  . Right hand surgery    . Nasal septal surgery    . Cardiac catheterization  11/25/2006    Dr. Haroldine Laws   History   Social History  . Marital Status: Married    Spouse Name: N/A  . Number of Children: N/A  . Years of Education: N/A   Social History Main Topics  . Smoking status: Never Smoker   . Smokeless tobacco: Not on file  . Alcohol Use: No  . Drug Use: No  . Sexual Activity: Not on file   Other Topics Concern  . None   Social History Narrative   Family History  Problem Relation Age of Onset  . Hypertension Mother 86  . Hypertension Father 55  . Hyperlipidemia Father   . Atrial fibrillation Father    Allergies  Allergen Reactions  . Erythromycin     REACTION: vomiting   Prior to Admission medications   Medication Sig Start Date End Date Taking? Authorizing Provider  FLUoxetine (PROZAC) 10 MG tablet Take 10 mg by mouth daily.   Yes Historical Provider, MD  gabapentin (NEURONTIN) 800 MG tablet Take 800 mg by mouth 2 (two) times daily.  08/12/11  Yes Historical  Provider, MD  propranolol (INDERAL) 20 MG tablet Take 10 mg by mouth as needed (bp).  08/19/11  Yes Deboraha Sprang, MD  zolpidem (AMBIEN) 10 MG tablet Take 10 mg by mouth at bedtime as needed.  07/25/11  Yes Historical Provider, MD  HYDROcodone-acetaminophen (NORCO) 5-325 MG per tablet Take 1-2 tablets by mouth every 4 (four) hours as needed. 10/19/14   Milly Jakob, MD   Dg Forearm Left  10/19/2014   CLINICAL DATA:  Tripped over dog in bedroom and fell, with obvious left wrist deformity. Initial encounter.  EXAM: LEFT FOREARM - 2 VIEW  COMPARISON:  None.  FINDINGS: There is a comminuted and mildly impacted fracture involving the distal radial metaphysis, with significant dorsal angulation and mild dorsal displacement. The distal ulna appears grossly intact. Surrounding soft tissue swelling is noted.  The elbow joint is incompletely assessed, but appears grossly unremarkable. The carpal rows articulate normally with the displaced distal radial fragments.  IMPRESSION: Comminuted mildly impacted fracture involving the distal radial metaphysis, with significant dorsal angulation and mild dorsal displacement.   Electronically Signed   By: Garald Balding M.D.   On: 10/19/2014 00:22   Dg Wrist Complete Left  10/19/2014   CLINICAL DATA:  Tripped over dog in  bedroom and fell, with obvious left wrist deformity. Initial encounter.  EXAM: LEFT WRIST - COMPLETE 3+ VIEW  COMPARISON:  None.  FINDINGS: There is a comminuted mildly impacted fracture involving the distal radial metaphysis, with significant dorsal angulation and mild dorsal displacement. Surrounding soft tissue swelling is noted.  The distal ulna appears intact. The carpal rows are grossly unremarkable in appearance, articulating normally with the displaced distal radius fragments.  IMPRESSION: Comminuted mildly impacted fracture involving the distal radial metaphysis, with significant dorsal angulation and mild dorsal displacement.   Electronically Signed    By: Garald Balding M.D.   On: 10/19/2014 00:26    Positive ROS: All other systems have been reviewed and were otherwise negative with the exception of those mentioned in the HPI and as above.  Physical Exam: Vitals: Refer to EMR. Constitutional:  WD, WN, NAD HEENT:  NCAT, EOMI Neuro/Psych:  Alert & oriented to person, place, and time; appropriate mood & affect Lymphatic: No generalized extremity edema or lymphadenopathy Extremities / MSK:  The extremities are normal with respect to appearance, ranges of motion, joint stability, muscle strength/tone, sensation, & perfusion except as otherwise noted:  Left wrist swollen, externally appears to have some shortening, angulation, and some radial deviation. As appears clinically to be a distal radius fracture. No swelling or tenderness about the ulna. Intact light touch sensibility across the radial, median, and ulnar nerve distributions with intact motor to the same. No tenderness about the elbow. Hand warm, pink, with brisk capillary refill  Assessment: Displaced dorsally tilted radially angulated distal radius fracture, left  Plan: I instilled a hematoma block with plain lidocaine, and then performed a gentle manipulative reduction. Alignment was improved to near anatomic parameters. Sugar tong splint was applied. No change post reduction with regarding neurovascular exam area patient's pain and pressure was improved.  I indicated to her that she should remain in the sling, using to help elevate her digits, working on digital range of motion to reduce swelling and pain as well as risk for neurovascular complications.  There is no significant loss of radial angulation on postreduction radiographs, and dorsal tilt now measures 10. We will plan for continued nonoperative management, with follow-up roughly in a week in the office. Office will call to arrange this.  Rayvon Char Grandville Silos, [address] [place], [place]  [id number] Office: [phone] Mobile: [phone]

## 2014-10-25 DIAGNOSIS — S52502D Unspecified fracture of the lower end of left radius, subsequent encounter for closed fracture with routine healing: Secondary | ICD-10-CM | POA: Diagnosis not present

## 2014-11-01 ENCOUNTER — Encounter (HOSPITAL_BASED_OUTPATIENT_CLINIC_OR_DEPARTMENT_OTHER): Payer: Self-pay | Admitting: *Deleted

## 2014-11-01 ENCOUNTER — Other Ambulatory Visit: Payer: Self-pay | Admitting: Orthopedic Surgery

## 2014-11-01 DIAGNOSIS — S52502D Unspecified fracture of the lower end of left radius, subsequent encounter for closed fracture with routine healing: Secondary | ICD-10-CM | POA: Diagnosis not present

## 2014-11-01 NOTE — H&P (Signed)
[name] is an 59 y.o. female.   CC / Reason for Visit:  Left wrist fracture HPI:  This patient returns today for re-evaluation of her left wrist injury.  She continues to wear the sugar tong splint and uses a sling.  The patient reports that the Percocet made her somewhat nauseous and requests a refill of a different type of pain medication.  Past Medical History  Diagnosis Date  . Dyspnea   . Fatigue   . Moderate mitral regurgitation   . Mitral valve prolapse     questional Hx  . Supraventricular tachycardia   . Orthostatic hypotension   . Vitamin B12 deficiency     mild  . IBS (irritable bowel syndrome)   . GERD (gastroesophageal reflux disease)   . Depression   . Migraine headache   . Paralysis     temporary  . Tuberculosis of spine (Pott's)   . Right hand fracture   . Complication of anesthesia     low to wake up  . Wears glasses     Past Surgical History  Procedure Laterality Date  . Tubal ligation      bilateral  . Right hand surgery  1980    rt thumb fx  . Nasal septal surgery  1985  . Cardiac catheterization  11/25/2006    Dr. Alonza Bogus attempt  . Colonoscopy      Family History  Problem Relation Age of Onset  . Hypertension Mother 27  . Hypertension Father 59  . Hyperlipidemia Father   . Atrial fibrillation Father    Social History:  reports that she has never smoked. She does not have any smokeless tobacco history on file. She reports that she drinks alcohol. She reports that she does not use illicit drugs.  Allergies:  Allergies  Allergen Reactions  . Erythromycin     REACTION: vomiting    No prescriptions prior to admission    No results found for this or any previous visit (from the past 48 hour(s)). No results found.  Review of Systems  All other systems reviewed and are negative.   Height 5\' 4"  (1.626 m), weight 62.143 kg (137 lb). Physical Exam  Constitutional:  WD, WN, NAD HEENT:  NCAT, EOMI Neuro/Psych:  Alert &  oriented to person, place, and time; appropriate mood & affect Lymphatic: No generalized UE edema or lymphadenopathy Extremities / MSK:  Both UE are normal with respect to appearance, ranges of motion, joint stability, muscle strength/tone, sensation, & perfusion except as otherwise noted:  Sugar tong splint is intact.  Swelling into all digits, patient is able to flex and extend.  NVI.   Labs / Xrays:  4 views of the left wrist ordered and obtained today in the splint reveal a closed distal radius metaphyseal fracture, now with about 15 dorsal tilt and still 3-4 mm of dorsal translation.  Assessment:  Left distal radius fracture  Plan: Today's findings were discussed with her.  We discussed the pros and cons of continued closed treatment, versus operative treatment to obtain and maintain a more anatomic alignment of distal radius in order to optimize functional recovery.  After careful deliberation, she opted to proceed surgically.  We will plan to proceed on Monday.  She indicates that she needs no additional pain medicine refills at this point.  The details of the operative procedure were discussed with the patient.  Questions were invited and answered.  In addition to the goal of the procedure, the risks of  the procedure to include but not limited to bleeding; infection; damage to the nerves or blood vessels that could result in bleeding, numbness, weakness, chronic pain, and the need for additional procedures; stiffness; the need for revision surgery; and anesthetic risks, were reviewed.  No specific outcome was guaranteed or implied.  Informed consent was obtained.  Zakia Sainato A. 11/01/2014, 2:13 PM

## 2014-11-01 NOTE — Progress Notes (Signed)
Pt here-hx svt- controlled-not had to take prn inderal a few months-very rare now-off a lot of meds-better-runs hypotensive-stays hydrated. Not had to see dr Caryl Comes 2 yr

## 2014-11-06 ENCOUNTER — Encounter (HOSPITAL_BASED_OUTPATIENT_CLINIC_OR_DEPARTMENT_OTHER): Payer: Self-pay | Admitting: *Deleted

## 2014-11-06 ENCOUNTER — Encounter (HOSPITAL_BASED_OUTPATIENT_CLINIC_OR_DEPARTMENT_OTHER)
Admission: RE | Disposition: A | Discharge status: Home / Self Care | Payer: Self-pay | Source: Ambulatory Visit | Attending: Orthopedic Surgery

## 2014-11-06 ENCOUNTER — Ambulatory Visit (HOSPITAL_BASED_OUTPATIENT_CLINIC_OR_DEPARTMENT_OTHER): Payer: Commercial Managed Care - HMO | Admitting: Certified Registered"

## 2014-11-06 ENCOUNTER — Ambulatory Visit (HOSPITAL_COMMUNITY): Payer: Commercial Managed Care - HMO

## 2014-11-06 ENCOUNTER — Ambulatory Visit (HOSPITAL_BASED_OUTPATIENT_CLINIC_OR_DEPARTMENT_OTHER)
Admission: RE | Admit: 2014-11-06 | Discharge: 2014-11-06 | Disposition: A | Discharge status: Home / Self Care | Payer: Commercial Managed Care - HMO | Source: Ambulatory Visit | Attending: Orthopedic Surgery | Admitting: Orthopedic Surgery

## 2014-11-06 DIAGNOSIS — M79632 Pain in left forearm: Secondary | ICD-10-CM | POA: Diagnosis not present

## 2014-11-06 DIAGNOSIS — F1099 Alcohol use, unspecified with unspecified alcohol-induced disorder: Secondary | ICD-10-CM | POA: Diagnosis not present

## 2014-11-06 DIAGNOSIS — Z9851 Tubal ligation status: Secondary | ICD-10-CM | POA: Diagnosis not present

## 2014-11-06 DIAGNOSIS — S52502D Unspecified fracture of the lower end of left radius, subsequent encounter for closed fracture with routine healing: Secondary | ICD-10-CM | POA: Diagnosis not present

## 2014-11-06 DIAGNOSIS — Z881 Allergy status to other antibiotic agents status: Secondary | ICD-10-CM | POA: Diagnosis not present

## 2014-11-06 DIAGNOSIS — F329 Major depressive disorder, single episode, unspecified: Secondary | ICD-10-CM | POA: Diagnosis not present

## 2014-11-06 DIAGNOSIS — S59292A Other physeal fracture of lower end of radius, left arm, initial encounter for closed fracture: Secondary | ICD-10-CM | POA: Insufficient documentation

## 2014-11-06 DIAGNOSIS — K219 Gastro-esophageal reflux disease without esophagitis: Secondary | ICD-10-CM | POA: Diagnosis not present

## 2014-11-06 DIAGNOSIS — G43909 Migraine, unspecified, not intractable, without status migrainosus: Secondary | ICD-10-CM | POA: Diagnosis not present

## 2014-11-06 DIAGNOSIS — Z4789 Encounter for other orthopedic aftercare: Secondary | ICD-10-CM

## 2014-11-06 DIAGNOSIS — G8918 Other acute postprocedural pain: Secondary | ICD-10-CM | POA: Diagnosis not present

## 2014-11-06 DIAGNOSIS — S52552A Other extraarticular fracture of lower end of left radius, initial encounter for closed fracture: Secondary | ICD-10-CM | POA: Diagnosis present

## 2014-11-06 DIAGNOSIS — X58XXXA Exposure to other specified factors, initial encounter: Secondary | ICD-10-CM | POA: Insufficient documentation

## 2014-11-06 DIAGNOSIS — S52502A Unspecified fracture of the lower end of left radius, initial encounter for closed fracture: Secondary | ICD-10-CM | POA: Diagnosis not present

## 2014-11-06 HISTORY — DX: Presence of spectacles and contact lenses: Z97.3

## 2014-11-06 HISTORY — DX: Other complications of anesthesia, initial encounter: T88.59XA

## 2014-11-06 HISTORY — PX: OPEN REDUCTION INTERNAL FIXATION (ORIF) DISTAL RADIAL FRACTURE: SHX5989

## 2014-11-06 HISTORY — DX: Adverse effect of unspecified anesthetic, initial encounter: T41.45XA

## 2014-11-06 LAB — POCT HEMOGLOBIN-HEMACUE: Hemoglobin: 13.3 g/dL (ref 12.0–15.0)

## 2014-11-06 SURGERY — OPEN REDUCTION INTERNAL FIXATION (ORIF) DISTAL RADIUS FRACTURE
Anesthesia: General | Site: Wrist | Laterality: Left

## 2014-11-06 MED ORDER — BUPIVACAINE-EPINEPHRINE (PF) 0.5% -1:200000 IJ SOLN
INTRAMUSCULAR | Status: DC | PRN
  Administered 2014-11-06: 23 mL via PERINEURAL

## 2014-11-06 MED ORDER — ONDANSETRON HCL 4 MG/2ML IJ SOLN: 4.0000 mg | Freq: Once | INTRAMUSCULAR | Status: DC | PRN

## 2014-11-06 MED ORDER — FENTANYL CITRATE (PF) 100 MCG/2ML IJ SOLN
INTRAMUSCULAR | Status: AC
  Filled 2014-11-06: qty 6

## 2014-11-06 MED ORDER — BUPIVACAINE-EPINEPHRINE (PF) 0.5% -1:200000 IJ SOLN
INTRAMUSCULAR | Status: AC
  Filled 2014-11-06: qty 90

## 2014-11-06 MED ORDER — DEXAMETHASONE SODIUM PHOSPHATE 10 MG/ML IJ SOLN
INTRAMUSCULAR | Status: DC | PRN
  Administered 2014-11-06: 10 mg via INTRAVENOUS

## 2014-11-06 MED ORDER — FENTANYL CITRATE (PF) 100 MCG/2ML IJ SOLN
INTRAMUSCULAR | Status: AC
  Filled 2014-11-06: qty 2

## 2014-11-06 MED ORDER — LACTATED RINGERS IV SOLN
INTRAVENOUS | Status: DC
  Administered 2014-11-06: 10:00:00 via INTRAVENOUS

## 2014-11-06 MED ORDER — FENTANYL CITRATE 0.05 MG/ML IJ SOLN
50.0000 ug | INTRAMUSCULAR | Status: DC | PRN
  Administered 2014-11-06: 25 ug via INTRAVENOUS
  Administered 2014-11-06: 100 ug via INTRAVENOUS

## 2014-11-06 MED ORDER — OXYCODONE HCL 5 MG PO TABS: 5.0000 mg | ORAL_TABLET | Freq: Once | ORAL | Status: DC | PRN

## 2014-11-06 MED ORDER — LIDOCAINE HCL (CARDIAC) 20 MG/ML IV SOLN
INTRAVENOUS | Status: DC | PRN
  Administered 2014-11-06: 30 mg via INTRAVENOUS

## 2014-11-06 MED ORDER — LACTATED RINGERS IV SOLN
INTRAVENOUS | Status: DC
  Administered 2014-11-06 (×2): via INTRAVENOUS

## 2014-11-06 MED ORDER — CEFAZOLIN SODIUM-DEXTROSE 2-3 GM-% IV SOLR
INTRAVENOUS | Status: AC
  Filled 2014-11-06: qty 50

## 2014-11-06 MED ORDER — HYDROMORPHONE HCL 1 MG/ML IJ SOLN
0.2500 mg | INTRAMUSCULAR | Status: DC | PRN
  Administered 2014-11-06 (×2): 0.5 mg via INTRAVENOUS

## 2014-11-06 MED ORDER — HYDROMORPHONE HCL 1 MG/ML IJ SOLN
INTRAMUSCULAR | Status: AC
  Filled 2014-11-06: qty 1

## 2014-11-06 MED ORDER — ONDANSETRON HCL 4 MG/2ML IJ SOLN
INTRAMUSCULAR | Status: DC | PRN
  Administered 2014-11-06: 4 mg via INTRAVENOUS

## 2014-11-06 MED ORDER — OXYCODONE HCL 5 MG/5ML PO SOLN: 5.0000 mg | Freq: Once | ORAL | Status: DC | PRN

## 2014-11-06 MED ORDER — MIDAZOLAM HCL 2 MG/2ML IJ SOLN
1.0000 mg | INTRAMUSCULAR | Status: DC | PRN
  Administered 2014-11-06: 2 mg via INTRAVENOUS

## 2014-11-06 MED ORDER — PROPOFOL 10 MG/ML IV BOLUS
INTRAVENOUS | Status: DC | PRN
  Administered 2014-11-06: 240 mg via INTRAVENOUS

## 2014-11-06 MED ORDER — CEFAZOLIN SODIUM-DEXTROSE 2-3 GM-% IV SOLR
2.0000 g | INTRAVENOUS | Status: AC
  Administered 2014-11-06: 2 g via INTRAVENOUS

## 2014-11-06 MED ORDER — MIDAZOLAM HCL 2 MG/2ML IJ SOLN
INTRAMUSCULAR | Status: AC
  Filled 2014-11-06: qty 2

## 2014-11-06 SURGICAL SUPPLY — 67 items
BANDAGE COBAN STERILE 2 (GAUZE/BANDAGES/DRESSINGS) IMPLANT
BIT DRILL 2 FAST STEP (BIT) ×2 IMPLANT
BIT DRILL 2.5X4 QC (BIT) ×2 IMPLANT
BLADE MINI RND TIP GREEN BEAV (BLADE) IMPLANT
BLADE SURG 15 STRL LF DISP TIS (BLADE) ×1 IMPLANT
BLADE SURG 15 STRL SS (BLADE) ×3
BNDG CMPR 9X4 STRL LF SNTH (GAUZE/BANDAGES/DRESSINGS) ×1
BNDG COHESIVE 4X5 TAN STRL (GAUZE/BANDAGES/DRESSINGS) ×3 IMPLANT
BNDG ESMARK 4X9 LF (GAUZE/BANDAGES/DRESSINGS) ×3 IMPLANT
BNDG GAUZE ELAST 4 BULKY (GAUZE/BANDAGES/DRESSINGS) ×6 IMPLANT
BRUSH SCRUB EZ PLAIN DRY (MISCELLANEOUS) IMPLANT
CANISTER SUCT 1200ML W/VALVE (MISCELLANEOUS) ×3 IMPLANT
CHLORAPREP W/TINT 26ML (MISCELLANEOUS) ×3 IMPLANT
CORDS BIPOLAR (ELECTRODE) ×3 IMPLANT
COVER BACK TABLE 60X90IN (DRAPES) ×3 IMPLANT
COVER MAYO STAND STRL (DRAPES) ×3 IMPLANT
CUFF TOURNIQUET SINGLE 18IN (TOURNIQUET CUFF) ×2 IMPLANT
CUFF TOURNIQUET SINGLE 24IN (TOURNIQUET CUFF) IMPLANT
DRAPE C-ARM 42X72 X-RAY (DRAPES) ×3 IMPLANT
DRAPE EXTREMITY T 121X128X90 (DRAPE) ×3 IMPLANT
DRAPE SURG 17X23 STRL (DRAPES) ×3 IMPLANT
DRSG ADAPTIC 3X8 NADH LF (GAUZE/BANDAGES/DRESSINGS) ×3 IMPLANT
DRSG EMULSION OIL 3X3 NADH (GAUZE/BANDAGES/DRESSINGS) IMPLANT
ELECT REM PT RETURN 9FT ADLT (ELECTROSURGICAL) ×3
ELECTRODE REM PT RTRN 9FT ADLT (ELECTROSURGICAL) ×1 IMPLANT
GAUZE SPONGE 4X4 12PLY STRL (GAUZE/BANDAGES/DRESSINGS) ×3 IMPLANT
GLOVE BIO SURGEON STRL SZ 6.5 (GLOVE) ×1 IMPLANT
GLOVE BIO SURGEON STRL SZ7.5 (GLOVE) ×3 IMPLANT
GLOVE BIO SURGEONS STRL SZ 6.5 (GLOVE) ×1
GLOVE BIOGEL PI IND STRL 7.0 (GLOVE) ×1 IMPLANT
GLOVE BIOGEL PI IND STRL 8 (GLOVE) ×1 IMPLANT
GLOVE BIOGEL PI INDICATOR 7.0 (GLOVE) ×6
GLOVE BIOGEL PI INDICATOR 8 (GLOVE) ×2
GLOVE ECLIPSE 6.5 STRL STRAW (GLOVE) ×3 IMPLANT
GOWN STRL REUS W/ TWL LRG LVL3 (GOWN DISPOSABLE) ×2 IMPLANT
GOWN STRL REUS W/TWL LRG LVL3 (GOWN DISPOSABLE) ×6
GOWN STRL REUS W/TWL XL LVL3 (GOWN DISPOSABLE) ×3 IMPLANT
NDL HYPO 25X1 1.5 SAFETY (NEEDLE) IMPLANT
NEEDLE HYPO 25X1 1.5 SAFETY (NEEDLE) IMPLANT
NS IRRIG 1000ML POUR BTL (IV SOLUTION) ×3 IMPLANT
PACK BASIN DAY SURGERY FS (CUSTOM PROCEDURE TRAY) ×3 IMPLANT
PADDING CAST ABS 4INX4YD NS (CAST SUPPLIES)
PADDING CAST ABS COTTON 4X4 ST (CAST SUPPLIES) IMPLANT
PEG SUBCHONDRAL SMOOTH 2.0X18 (Peg) ×4 IMPLANT
PEG SUBCHONDRAL SMOOTH 2.0X20 (Peg) ×6 IMPLANT
PEG SUBCHONDRAL SMOOTH 2.0X22 (Peg) ×2 IMPLANT
PENCIL BUTTON HOLSTER BLD 10FT (ELECTRODE) ×3 IMPLANT
PLATE SHORT 21.6X48.9 NRRW LT (Plate) ×2 IMPLANT
RUBBERBAND STERILE (MISCELLANEOUS) IMPLANT
SCREW BN 12X3.5XNS CORT TI (Screw) IMPLANT
SCREW CORT 3.5X10 LNG (Screw) ×2 IMPLANT
SCREW CORT 3.5X12 (Screw) ×6 IMPLANT
SLEEVE SCD COMPRESS KNEE MED (MISCELLANEOUS) ×3 IMPLANT
STOCKINETTE 6  STRL (DRAPES) ×2
STOCKINETTE 6 STRL (DRAPES) ×1 IMPLANT
SUCTION FRAZIER TIP 10 FR DISP (SUCTIONS) ×3 IMPLANT
SUT VIC AB 2-0 PS2 27 (SUTURE) ×3 IMPLANT
SUT VICRYL 4-0 PS2 18IN ABS (SUTURE) IMPLANT
SUT VICRYL RAPIDE 4-0 (SUTURE) IMPLANT
SUT VICRYL RAPIDE 4/0 PS 2 (SUTURE) ×3 IMPLANT
SYR BULB 3OZ (MISCELLANEOUS) ×3 IMPLANT
SYRINGE 10CC LL (SYRINGE) IMPLANT
TOWEL OR 17X24 6PK STRL BLUE (TOWEL DISPOSABLE) ×3 IMPLANT
TOWEL OR NON WOVEN STRL DISP B (DISPOSABLE) ×3 IMPLANT
TUBE CONNECTING 20'X1/4 (TUBING) ×1
TUBE CONNECTING 20X1/4 (TUBING) ×2 IMPLANT
UNDERPAD 30X30 INCONTINENT (UNDERPADS AND DIAPERS) ×3 IMPLANT

## 2014-11-06 NOTE — Op Note (Signed)
11/06/2014  7:49 AM  PATIENT:  [name]  59 y.o. female  PRE-OPERATIVE DIAGNOSIS:  Displaced left distal radius fracture  POST-OPERATIVE DIAGNOSIS:  Same  PROCEDURE:  ORIF left distal radius fracture, 25607  SURGEON: Rayvon Char. Grandville Silos, MD  PHYSICIAN ASSISTANT: Morley Kos, OPA-C  ANESTHESIA:  regional and general  SPECIMENS:  None  DRAINS: None  EBL:  less than 50 mL  PREOPERATIVE INDICATIONS:  [name] is a  59 y.o. female with displaced, dorsally tilted left distal radius fracture.  The risks benefits and alternatives were discussed with the patient preoperatively including but not limited to the risks of infection, bleeding, nerve injury, cardiopulmonary complications, the need for revision surgery, among others, and the patient verbalized understanding and consented to proceed.  OPERATIVE IMPLANTS: Biomet DVR plate with appropriate screws/pegs  OPERATIVE PROCEDURE: After receiving prophylactic antibiotics and a regional block, the patient was escorted to the operative theatre and placed in a supine position. General anesthesia was administered.  A surgical "time-out" was performed during which the planned procedure, proposed operative site, and the correct patient identity were compared to the operative consent and agreement confirmed by the circulating nurse according to current facility policy. Following application of a tourniquet to the operative extremity, the exposed skin was pre-scrub with Hibiclens scrub brush and then was prepped with Chloraprep and draped in the usual sterile fashion. The limb was exsanguinated with an Esmarch bandage and the tourniquet inflated to approximately 146mmHg higher than systolic BP.   A sinusoidal-shaped incision was marked and made over the FCR axis and the distal forearm. The skin was incised sharply with scalpel, subcutaneous tissues with blunt and spreading dissection. The FCR axis was exploited deeply. The pronator  quadratus was reflected in an L-shaped ulnarly. The fracture was inspected and provisionally reduced.  This was confirmed fluoroscopically. The appropriately sized plate was selected and found to fit well. It was placed in its provisional alignment of the radius and this was confirmed fluoroscopically.  It was secured to the radius with a screw through the slotted hole.  Additional adjustments were made as necessary, and the distal holes were all drilled and filled.  Peg/screw length distally was selected on the shorter side of measurements to minimize the risk for dorsal cortical penetration. The remainder of the proximal holes were drilled and filled.   Final images were obtained and the DRUJ was examined for stability. It was found to be sufficiently stable. The wound was then copiously irrigated and the brachioradialis repaired with 2-0 Vicryl Rapide suture followed by repair of the pronator quadratus with the same suture type. Tourniquet was released and additional hemostasis obtained and the skin was closed with 2-0 Vicryl deep dermal buried sutures followed by running 4-0 Vicryl Rapide horizontal mattress suture in the skin. A bulky dressing with a volar plaster component was applied and she was taken to room stable condition.  DISPOSITION: The patient will be discharged home today with typical post-op instructions, returning in 10-15 days for reevaluation with new x-rays of the affected wrist out of the splint to include an inclined lateral and then transition to therapy to have a custom splint constructed and begin rehabilitation.

## 2014-11-06 NOTE — Anesthesia Procedure Notes (Addendum)
Anesthesia Regional Block:  Supraclavicular block  Pre-Anesthetic Checklist: ,, timeout performed, Correct Patient, Correct Site, Correct Laterality, Correct Procedure, Correct Position, site marked, Risks and benefits discussed,  Surgical consent,  Pre-op evaluation,  At surgeon's request and post-op pain management  Laterality: Left and Upper  Prep: chloraprep       Needles:  Injection technique: Single-shot  Needle Type: Echogenic Stimulator Needle     Needle Length: 5cm 5 cm Needle Gauge: 21 and 21 G    Additional Needles:  Procedures: ultrasound guided (picture in chart) Supraclavicular block Narrative:  Start time: 11/06/2014 7:10 AM End time: 11/06/2014 7:15 AM Injection made incrementally with aspirations every 5 mL.  Performed by: Personally  Anesthesiologist: CREWS, DAVID   Procedure Name: LMA Insertion Date/Time: 11/06/2014 7:56 AM Performed by: Leafy Motsinger D Pre-anesthesia Checklist: Patient identified, Emergency Drugs available, Suction available and Patient being monitored Patient Re-evaluated:Patient Re-evaluated prior to inductionOxygen Delivery Method: Circle System Utilized Preoxygenation: Pre-oxygenation with 100% oxygen Intubation Type: IV induction Ventilation: Mask ventilation without difficulty LMA: LMA inserted LMA Size: 4.0 Number of attempts: 1 Airway Equipment and Method: Bite block Placement Confirmation: positive ETCO2 Tube secured with: Tape Dental Injury: Teeth and Oropharynx as per pre-operative assessment

## 2014-11-06 NOTE — Anesthesia Postprocedure Evaluation (Signed)
  Anesthesia Post-op Note  Patient: [name]  Procedure(s) Performed: Procedure(s) with comments: OPEN REDUCTION INTERNAL FIXATION (ORIF) LEFT DISTAL RADIAL FRACTURE (Left) - ANESTHESIA:  GENERAL, PRE/OP BLOCK  Patient Location: PACU  Anesthesia Type: General   Level of Consciousness: awake, alert  and oriented  Airway and Oxygen Therapy: Patient Spontanous Breathing  Post-op Pain: mild  Post-op Assessment: Post-op Vital signs reviewed  Post-op Vital Signs: Reviewed  Last Vitals:  Filed Vitals:   11/06/14 1114  BP: 103/59  Pulse: 90  Temp: 36.6 C  Resp: 16    Complications: No apparent anesthesia complications

## 2014-11-06 NOTE — Progress Notes (Signed)
  Assisted Dr. Crews with left, ultrasound guided, supraclavicular block. Side rails up, monitors on throughout procedure. See vital signs in flow sheet. Tolerated Procedure well. 

## 2014-11-06 NOTE — Discharge Instructions (Signed)
Discharge Instructions ° ° °You have a dressing with a plaster splint incorporated in it. °Move your fingers as much as possible, making a full fist and fully opening the fist. °Elevate your hand to reduce pain & swelling of the digits.  Ice over the operative site may be helpful to reduce pain & swelling.  DO NOT USE HEAT. °Pain medicine has been prescribed for you.  °Use your medicine as needed over the first 48 hours, and then you can begin to taper your use.  You may use Tylenol in place of your prescribed pain medication, but not IN ADDITION to it. °Leave the dressing in place until you return to our office.  °You may shower, but keep the bandage clean & dry.  °You may drive a car when you are off of prescription pain medications and can safely control your vehicle with both hands. °Our office will call you to arrange follow-up ° ° °Please call [phone] during normal business hours or [phone] after hours for any problems. Including the following: ° °- excessive redness of the incisions °- drainage for more than 4 days °- fever of more than 101.5 F ° °*Please note that pain medications will not be refilled after hours or on weekends. ° ° °Regional Anesthesia Blocks ° °1. Numbness or the inability to move the "blocked" extremity may last from 3-48 hours after placement. The length of time depends on the medication injected and your individual response to the medication. If the numbness is not going away after 48 hours, call your surgeon. ° °2. The extremity that is blocked will need to be protected until the numbness is gone and the  Strength has returned. Because you cannot feel it, you will need to take extra care to avoid injury. Because it may be weak, you may have difficulty moving it or using it. You may not know what position it is in without looking at it while the block is in effect. ° °3. For blocks in the legs and feet, returning to weight bearing and walking needs to be done carefully. You  will need to wait until the numbness is entirely gone and the strength has returned. You should be able to move your leg and foot normally before you try and bear weight or walk. You will need someone to be with you when you first try to ensure you do not fall and possibly risk injury. ° °4. Bruising and tenderness at the needle site are common side effects and will resolve in a few days. ° °5. Persistent numbness or new problems with movement should be communicated to the surgeon or the [facility] ([phone])/ [facility] ([phone]). ° °Post Anesthesia Home Care Instructions ° °Activity: °Get plenty of rest for the remainder of the day. A responsible adult should stay with you for 24 hours following the procedure.  °For the next 24 hours, DO NOT: °-Drive a car °-Operate machinery °-Drink alcoholic beverages °-Take any medication unless instructed by your physician °-Make any legal decisions or sign important papers. ° °Meals: °Start with liquid foods such as gelatin or soup. Progress to regular foods as tolerated. Avoid greasy, spicy, heavy foods. If nausea and/or vomiting occur, drink only clear liquids until the nausea and/or vomiting subsides. Call your physician if vomiting continues. ° °Special Instructions/Symptoms: °Your throat may feel dry or sore from the anesthesia or the breathing tube placed in your throat during surgery. If this causes discomfort, gargle with warm salt water.   The discomfort should disappear within 24 hours. ° °If you had a scopolamine patch placed behind your ear for the management of post- operative nausea and/or vomiting: ° °1. The medication in the patch is effective for 72 hours, after which it should be removed.  Wrap patch in a tissue and discard in the trash. Wash hands thoroughly with soap and water. °2. You may remove the patch earlier than 72 hours if you experience unpleasant side effects which may include dry mouth, dizziness or visual  disturbances. °3. Avoid touching the patch. Wash your hands with soap and water after contact with the patch. °  ° °

## 2014-11-06 NOTE — Transfer of Care (Signed)
Immediate Anesthesia Transfer of Care Note  Patient: [name]  Procedure(s) Performed: Procedure(s) with comments: OPEN REDUCTION INTERNAL FIXATION (ORIF) LEFT DISTAL RADIAL FRACTURE (Left) - ANESTHESIA:  GENERAL, PRE/OP BLOCK  Patient Location: PACU  Anesthesia Type:GA combined with regional for post-op pain  Level of Consciousness: awake and patient cooperative  Airway & Oxygen Therapy: Patient Spontanous Breathing and Patient connected to face mask oxygen  Post-op Assessment: Report given to RN and Post -op Vital signs reviewed and stable  Post vital signs: Reviewed and stable  Last Vitals:  Filed Vitals:   11/06/14 0745  BP: 137/69  Pulse: 92  Temp:   Resp: 25    Complications: No apparent anesthesia complications

## 2014-11-06 NOTE — Interval H&P Note (Signed)
History and Physical Interval Note:  11/06/2014 7:49 AM  [name]  has presented today for surgery, with the diagnosis of LEFT DISTAL RADIUS FRACTURE  The various methods of treatment have been discussed with the patient and family. After consideration of risks, benefits and other options for treatment, the patient has consented to  Procedure(s) with comments: OPEN REDUCTION INTERNAL FIXATION (ORIF) LEFT DISTAL RADIAL FRACTURE (Left) - ANESTHESIA:  GENERAL, PRE/OP BLOCK as a surgical intervention .  The patient's history has been reviewed, patient examined, no change in status, stable for surgery.  I have reviewed the patient's chart and labs.  Questions were answered to the patient's satisfaction.     Kendle Erker A.

## 2014-11-06 NOTE — Anesthesia Preprocedure Evaluation (Addendum)
Anesthesia Evaluation  Patient identified by MRN, date of birth, ID band Patient awake    Reviewed: Allergy & Precautions, NPO status   Airway Mallampati: I  TM Distance: >3 FB Neck ROM: Full    Dental  (+) Teeth Intact, Dental Advisory Given   Pulmonary shortness of breath,  breath sounds clear to auscultation        Cardiovascular + dysrhythmias (Hx of unexplained tachycardia) + Valvular Problems/Murmurs (Mild MR) MR Rhythm:Regular Rate:Normal  2/13 Echo, EF 55-60 %   Neuro/Psych  Headaches, Depression    GI/Hepatic GERD-  Medicated and Controlled,  Endo/Other    Renal/GU      Musculoskeletal   Abdominal   Peds  Hematology   Anesthesia Other Findings   Reproductive/Obstetrics                           Anesthesia Physical Anesthesia Plan  ASA: II  Anesthesia Plan: General   Post-op Pain Management:    Induction: Intravenous  Airway Management Planned: LMA  Additional Equipment:   Intra-op Plan:   Post-operative Plan: Extubation in OR  Informed Consent: I have reviewed the patients History and Physical, chart, labs and discussed the procedure including the risks, benefits and alternatives for the proposed anesthesia with the patient or authorized representative who has indicated his/her understanding and acceptance.   Dental advisory given  Plan Discussed with: CRNA, Anesthesiologist and Surgeon  Anesthesia Plan Comments:         Anesthesia Quick Evaluation

## 2014-11-07 ENCOUNTER — Encounter (HOSPITAL_BASED_OUTPATIENT_CLINIC_OR_DEPARTMENT_OTHER): Payer: Self-pay | Admitting: Orthopedic Surgery

## 2014-11-16 DIAGNOSIS — S52502D Unspecified fracture of the lower end of left radius, subsequent encounter for closed fracture with routine healing: Secondary | ICD-10-CM | POA: Diagnosis not present

## 2014-11-16 DIAGNOSIS — S52592D Other fractures of lower end of left radius, subsequent encounter for closed fracture with routine healing: Secondary | ICD-10-CM | POA: Diagnosis not present

## 2014-12-13 DIAGNOSIS — S52502D Unspecified fracture of the lower end of left radius, subsequent encounter for closed fracture with routine healing: Secondary | ICD-10-CM | POA: Diagnosis not present

## 2014-12-13 DIAGNOSIS — S52592D Other fractures of lower end of left radius, subsequent encounter for closed fracture with routine healing: Secondary | ICD-10-CM | POA: Diagnosis not present

## 2015-01-08 DIAGNOSIS — G479 Sleep disorder, unspecified: Secondary | ICD-10-CM | POA: Diagnosis not present

## 2015-01-08 DIAGNOSIS — R252 Cramp and spasm: Secondary | ICD-10-CM | POA: Diagnosis not present

## 2015-01-08 DIAGNOSIS — I951 Orthostatic hypotension: Secondary | ICD-10-CM | POA: Diagnosis not present

## 2015-01-08 DIAGNOSIS — F419 Anxiety disorder, unspecified: Secondary | ICD-10-CM | POA: Diagnosis not present

## 2015-01-25 DIAGNOSIS — S52502D Unspecified fracture of the lower end of left radius, subsequent encounter for closed fracture with routine healing: Secondary | ICD-10-CM | POA: Diagnosis not present

## 2015-03-29 ENCOUNTER — Telehealth: Payer: Self-pay | Admitting: Internal Medicine

## 2015-03-29 NOTE — Telephone Encounter (Signed)
Informed patient I would review with Dr. Caryl Comes and call her with recommendations. Patient verbalized understanding and agreeable to plan.

## 2015-03-29 NOTE — Telephone Encounter (Signed)
New Message    Pt has not seen pt in a while she wants to know if she can come in for a Echo,   Ultrasound please call pt

## 2015-04-09 ENCOUNTER — Other Ambulatory Visit: Payer: Self-pay | Admitting: Gynecology

## 2015-04-09 DIAGNOSIS — Z01419 Encounter for gynecological examination (general) (routine) without abnormal findings: Secondary | ICD-10-CM | POA: Diagnosis not present

## 2015-04-09 DIAGNOSIS — Z124 Encounter for screening for malignant neoplasm of cervix: Secondary | ICD-10-CM | POA: Diagnosis not present

## 2015-04-09 DIAGNOSIS — Z6823 Body mass index (BMI) 23.0-23.9, adult: Secondary | ICD-10-CM | POA: Diagnosis not present

## 2015-04-09 DIAGNOSIS — Z1212 Encounter for screening for malignant neoplasm of rectum: Secondary | ICD-10-CM | POA: Diagnosis not present

## 2015-04-09 NOTE — Telephone Encounter (Signed)
Informed patient Dr. Caryl Comes would like to see her in office to determine what test/s needed to be ordered. She is aware Melissa, scheduler, will call her this week to arrange OV. Patient verbalized understanding and agreeable to plan.

## 2015-04-10 LAB — CYTOLOGY - PAP

## 2015-04-25 ENCOUNTER — Ambulatory Visit (INDEPENDENT_AMBULATORY_CARE_PROVIDER_SITE_OTHER): Payer: Commercial Managed Care - HMO | Admitting: Internal Medicine

## 2015-04-25 ENCOUNTER — Encounter: Payer: Self-pay | Admitting: Internal Medicine

## 2015-04-25 VITALS — BP 112/82 | HR 61 | Ht 64.0 in | Wt 135.4 lb

## 2015-04-25 DIAGNOSIS — I9589 Other hypotension: Secondary | ICD-10-CM

## 2015-04-25 DIAGNOSIS — R002 Palpitations: Secondary | ICD-10-CM | POA: Diagnosis not present

## 2015-04-25 DIAGNOSIS — I340 Nonrheumatic mitral (valve) insufficiency: Secondary | ICD-10-CM

## 2015-04-25 NOTE — Progress Notes (Signed)
Patient Care Team: Kathyrn Lass, MD as PCP - General (Family Medicine) Deboraha Sprang, MD as Consulting Physician (Cardiology)   HPI  [name] is a 59 y.o. female seen in followup for recurrent spells of lightheadedness and dizziness. She has seen multiple physicians over the years for this with varying diagnoses. Most recently she was at [facility] for evaluation of "spells". Neurology service probably thought that these were"all in her head".   About t2011she stopped taking a bunch of her medications including topomax, Vicodin Florinef Vicodin, muscle relaxants and half of her Neurontin.   At this point she is feeling better than she has in years. She has mild lightheadedness. Her energy is better than it has been. 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      Past Medical History  Diagnosis Date  . Dyspnea   . Fatigue   . Moderate mitral regurgitation   . Mitral valve prolapse     questional Hx  . Supraventricular tachycardia ([place])   . Orthostatic hypotension   . Vitamin B12 deficiency     mild  . IBS (irritable bowel syndrome)   . GERD (gastroesophageal reflux disease)   . Depression   . Migraine headache   . Paralysis ([place])     temporary  . Tuberculosis of spine ([place])   . Right hand fracture   . Complication of anesthesia     low to wake up  . Wears glasses     Past Surgical History  Procedure Laterality Date  . Tubal ligation      bilateral  . Right hand surgery  1980    rt thumb fx  . Nasal septal surgery  1985  . Cardiac catheterization  11/25/2006    Dr. Alonza Bogus attempt  . Colonoscopy    . Open reduction internal fixation (orif) distal radial fracture Left 11/06/2014    Procedure: OPEN REDUCTION INTERNAL FIXATION (ORIF) LEFT DISTAL RADIAL FRACTURE;  Surgeon: Milly Jakob,  MD;  Location: [facility];  Service: Orthopedics;  Laterality: Left;  ANESTHESIA:  GENERAL, PRE/OP BLOCK    Current Outpatient Prescriptions  Medication Sig Dispense Refill  . FLUoxetine (PROZAC) 10 MG tablet Take 10 mg by mouth daily.    Marland Kitchen gabapentin (NEURONTIN) 800 MG tablet Take 800 mg by mouth 2 (two) times daily.     . propranolol (INDERAL) 20 MG tablet Take 10 mg by mouth as needed (bp).   0  . zolpidem (AMBIEN) 10 MG tablet Take 10 mg by mouth at bedtime as needed.      No current facility-administered medications for this visit.    Allergies  Allergen Reactions  . Erythromycin     REACTION: vomiting      Review of Systems negative except from HPI and PMH  Physical Exam BP 112/82 mmHg  Pulse 61  Ht 5\' 4"  (1.626 m)  Wt 135 lb 6.4 oz (61.417 kg)  BMI 23.23 kg/m2 Well developed and well nourished in no acute distress HENT normal E scleral and icterus clear Neck Supple JVP flat; carotids brisk and full Clear to ausculation  Regular rate and rhythm, no murmurs gallops or rub Soft with active bowel sounds No clubbing cyanosis  Edema Alert and oriented, grossly normal motor and sensory function Skin  Warm and Dry   echocardiogram dated today demonstrates sinus rhythm at 61 Intervals 16/09/45  Axis is 75  Assessment and  Plan   palpitations   Intermittent hypotension   Dysautonomia   mitral regurgitation-mild    [name] has been doing better recently than she has in years. She has some intermittent dizziness with hypotension. She is managing it nonpharmacologically. She takes her Inderal almost never we will continue her on her current medications.   she has not echo follow-up for her mitral valve; with her last report demonstrating mild mitral valve regurgitation I think will defer evaluation at this time

## 2015-04-25 NOTE — Patient Instructions (Signed)
Medication Instructions: - no changes  Labwork: - none  Procedures/Testing: - none  Follow-Up: - Your physician wants you to follow-up in: 2 years with Dr. Caryl Comes. You will receive a reminder letter in the mail two months in advance. If you don't receive a letter, please call our office to schedule the follow-up appointment.  Any Additional Special Instructions Will Be Listed Below (If Applicable). - none

## 2015-06-04 DIAGNOSIS — F411 Generalized anxiety disorder: Secondary | ICD-10-CM | POA: Diagnosis not present

## 2015-06-04 DIAGNOSIS — M791 Myalgia: Secondary | ICD-10-CM | POA: Diagnosis not present

## 2015-06-04 DIAGNOSIS — G479 Sleep disorder, unspecified: Secondary | ICD-10-CM | POA: Diagnosis not present

## 2015-08-03 IMAGING — CR DG FOREARM 2V*L*
2 series · 2 of 2 positions shown · non-contrast
Comparison: None.

CLINICAL DATA: Tripped over dog in bedroom and fell, with obvious
left wrist deformity. Initial encounter.

EXAM:
LEFT FOREARM - 2 VIEW

[x forearm ap left]
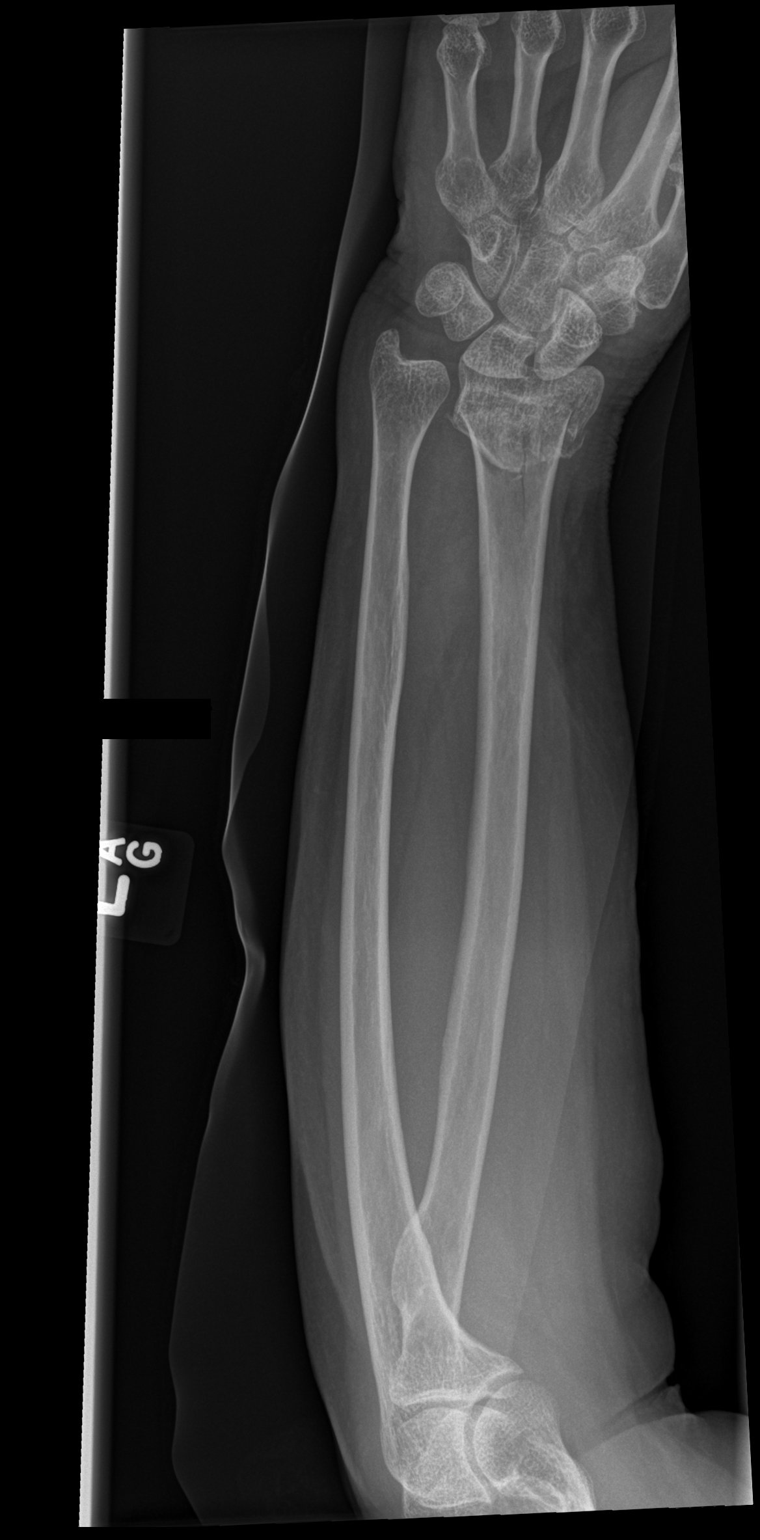

[x forearm lat left]
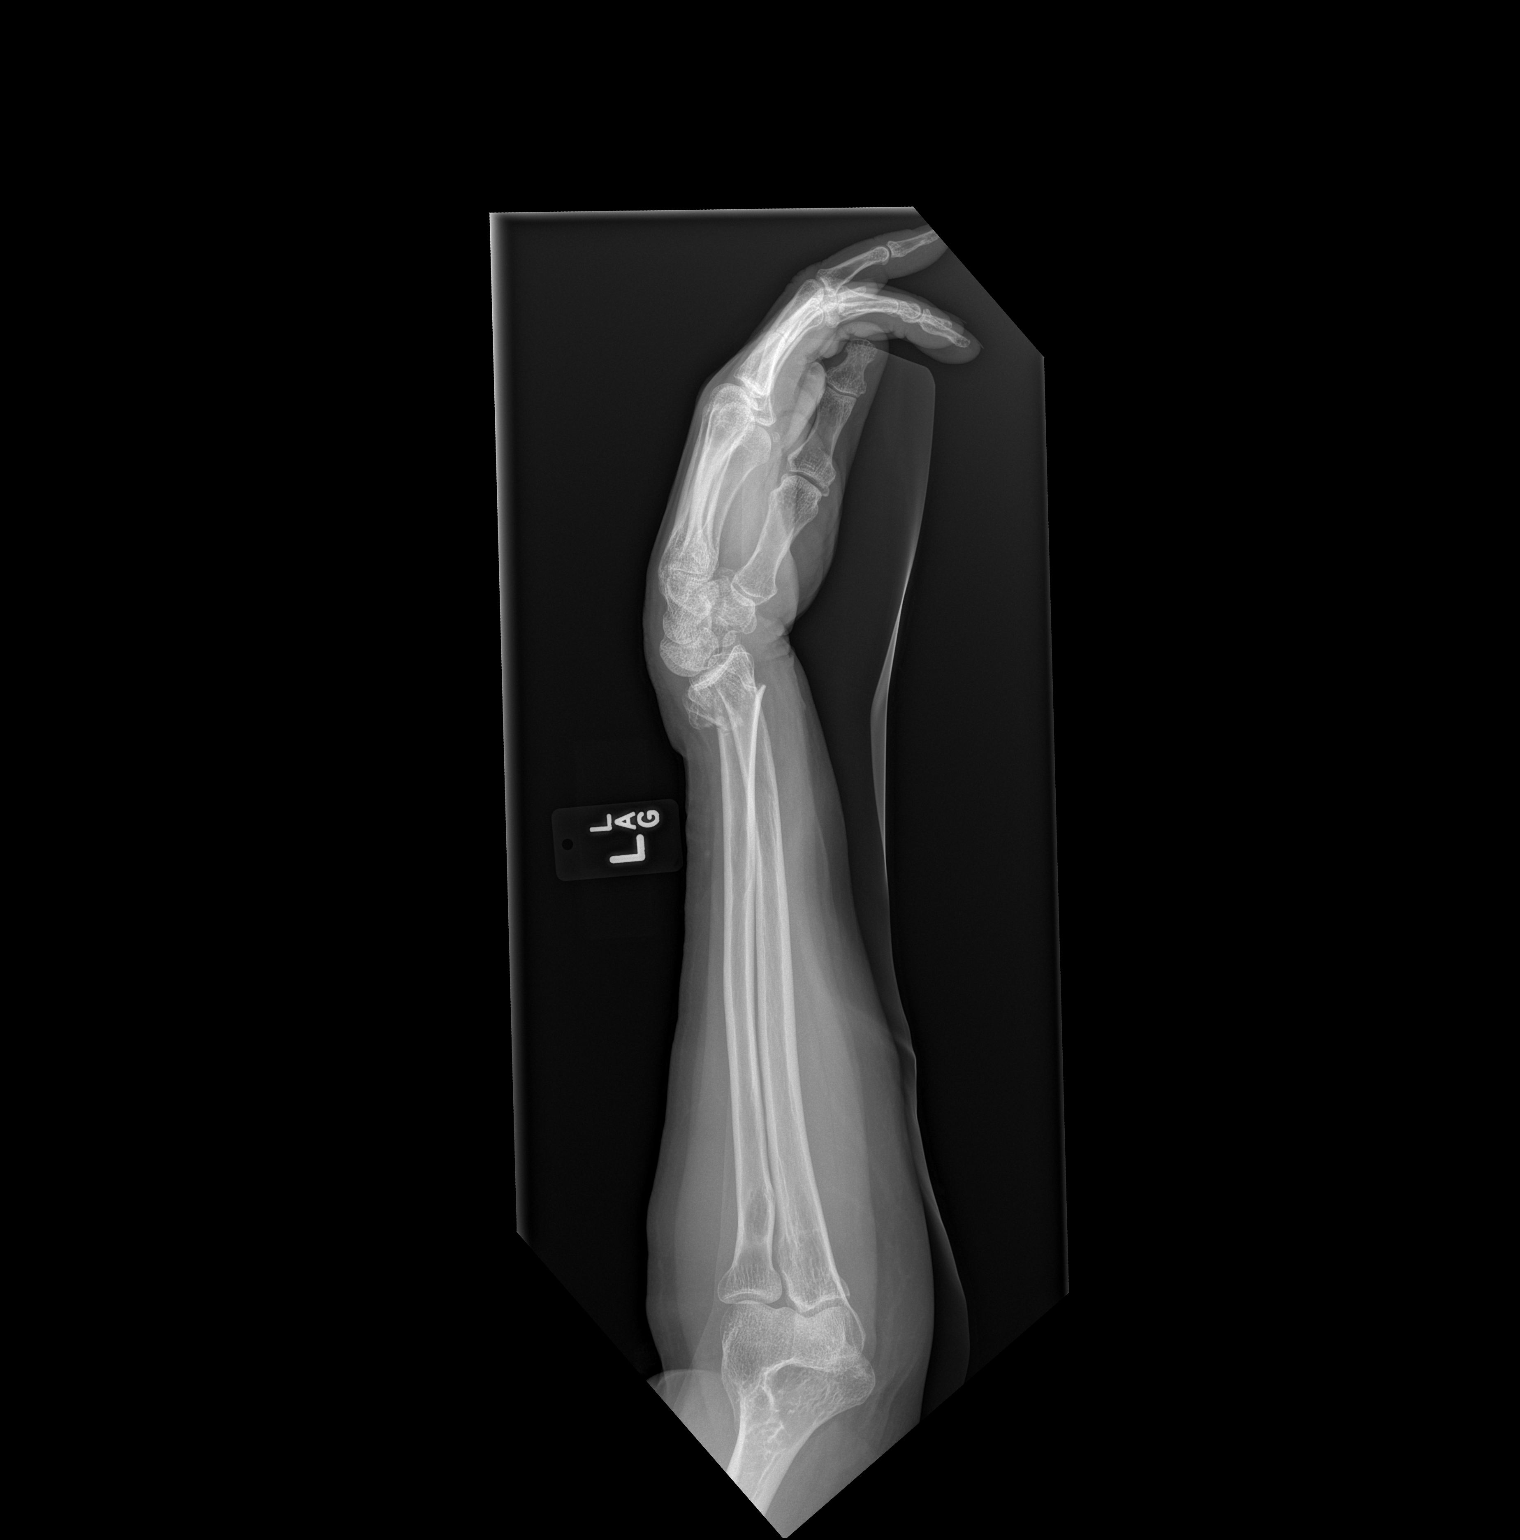

[2 of 2 positions shown; findings below may reference images not displayed]

FINDINGS: There is a comminuted and mildly impacted fracture involving the
distal radial metaphysis, with significant dorsal angulation and
mild dorsal displacement. The distal ulna appears grossly intact.
Surrounding soft tissue swelling is noted.

The elbow joint is incompletely assessed, but appears grossly
unremarkable. The carpal rows articulate normally with the displaced
distal radial fragments.
IMPRESSION: Comminuted mildly impacted fracture involving the distal radial
metaphysis, with significant dorsal angulation and mild dorsal
displacement.

## 2015-08-04 IMAGING — DX DG WRIST 2V*L*
3 series · 4 of 4 positions shown · non-contrast
Comparison: Left wrist radiographs performed 10/18/2014

CLINICAL DATA: Status post reduction of distal radius fracture.
Initial encounter.

EXAM:
LEFT WRIST - 2 VIEW

[wrist pa]
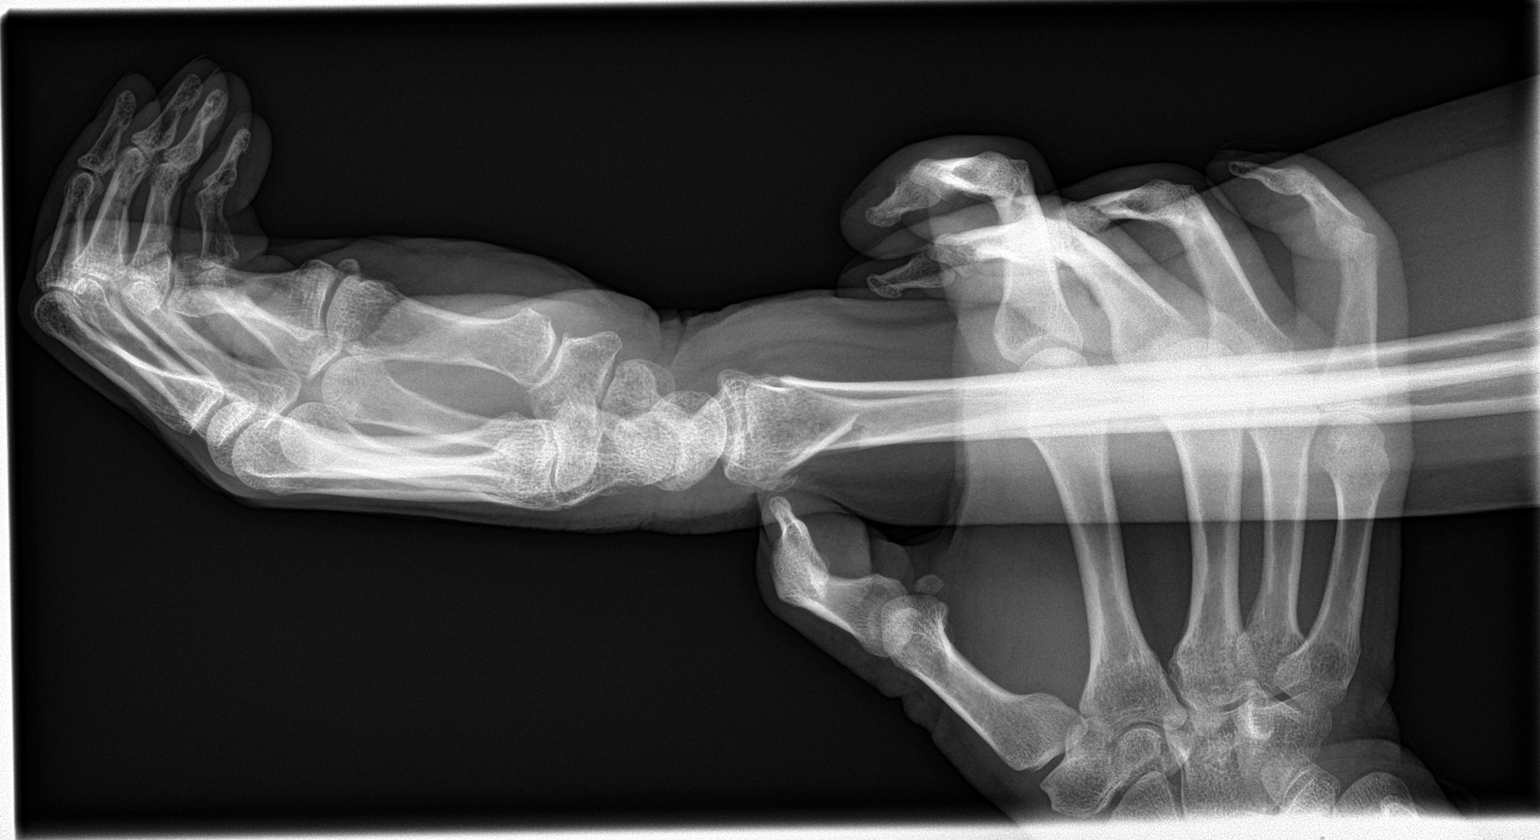

[wrist lat]
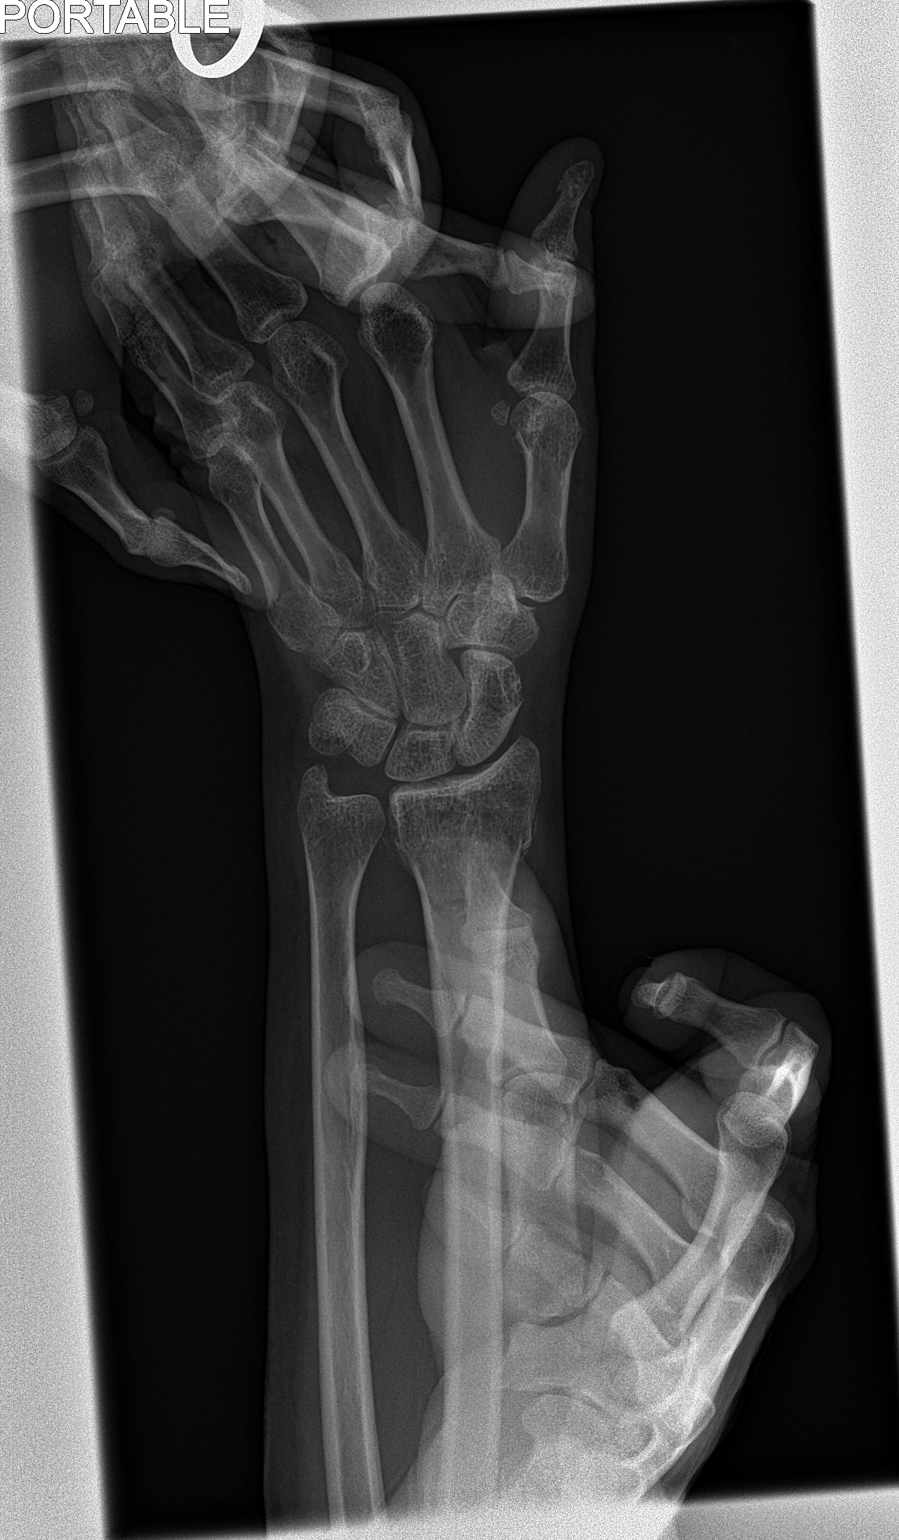

[Series 3: wrist obl · 0.14mm/px · 2 of 2 slices shown]
[im 1/2]
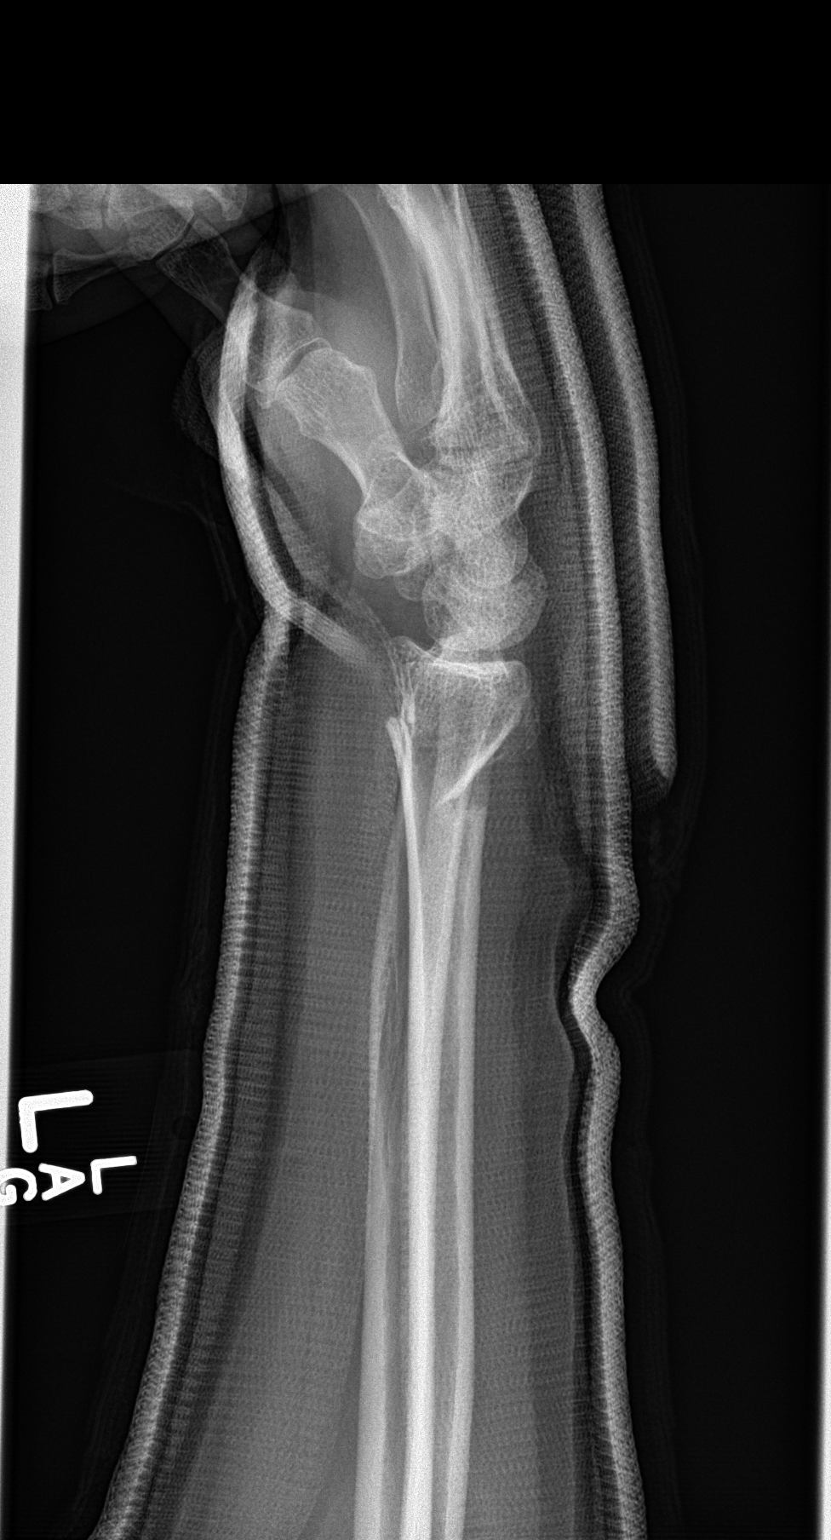
[im 2/2]
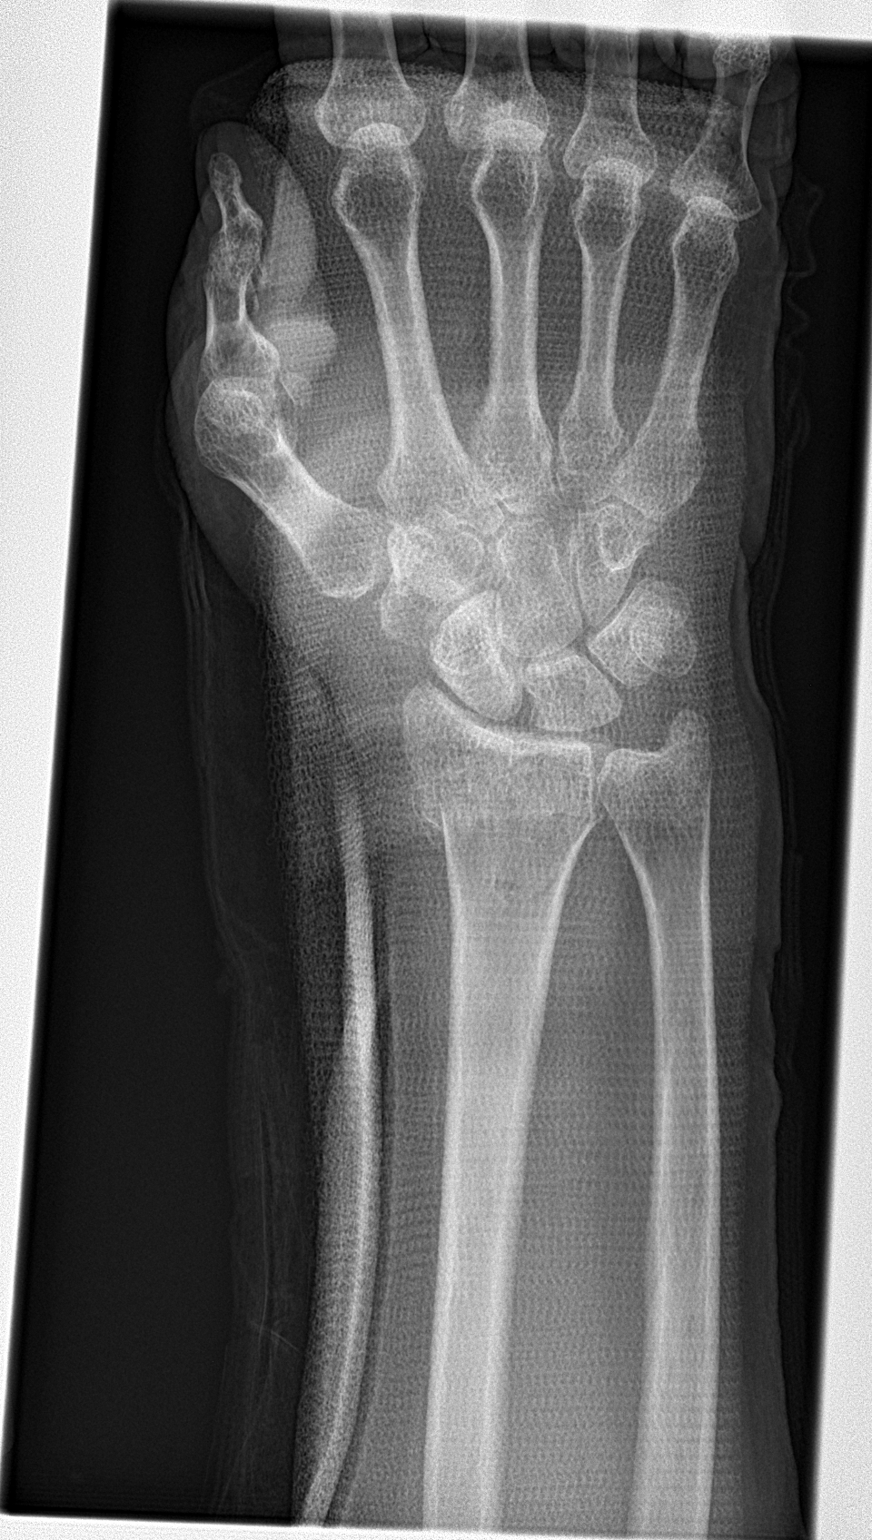

[4 of 4 positions shown; findings below may reference images not displayed]

FINDINGS: The comminuted fracture of the distal radial metaphysis demonstrates
improved alignment, with slight residual dorsal displacement and
mild angulation of a dorsal cortical fragment. No significant
angulation of the distal fragments is seen.

Soft tissue swelling is noted about the wrist. No new fractures are
seen. The carpal rows appear grossly intact.
IMPRESSION: Comminuted fracture of the distal radial metaphysis demonstrates
improved alignment, with slight residual dorsal displacement. No
significant angulation seen. Mild angulation of a small dorsal
cortical fragment.

## 2015-12-12 DIAGNOSIS — F419 Anxiety disorder, unspecified: Secondary | ICD-10-CM | POA: Diagnosis not present

## 2015-12-12 DIAGNOSIS — G479 Sleep disorder, unspecified: Secondary | ICD-10-CM | POA: Diagnosis not present

## 2015-12-12 DIAGNOSIS — M791 Myalgia: Secondary | ICD-10-CM | POA: Diagnosis not present

## 2015-12-12 DIAGNOSIS — I951 Orthostatic hypotension: Secondary | ICD-10-CM | POA: Diagnosis not present

## 2016-05-09 DIAGNOSIS — G4700 Insomnia, unspecified: Secondary | ICD-10-CM | POA: Diagnosis not present

## 2016-05-09 DIAGNOSIS — M791 Myalgia: Secondary | ICD-10-CM | POA: Diagnosis not present

## 2016-05-09 DIAGNOSIS — N941 Unspecified dyspareunia: Secondary | ICD-10-CM | POA: Diagnosis not present

## 2016-05-09 DIAGNOSIS — R195 Other fecal abnormalities: Secondary | ICD-10-CM | POA: Diagnosis not present

## 2016-05-22 DIAGNOSIS — Z01411 Encounter for gynecological examination (general) (routine) with abnormal findings: Secondary | ICD-10-CM | POA: Diagnosis not present

## 2016-05-22 DIAGNOSIS — Z1231 Encounter for screening mammogram for malignant neoplasm of breast: Secondary | ICD-10-CM | POA: Diagnosis not present

## 2016-05-22 DIAGNOSIS — R8299 Other abnormal findings in urine: Secondary | ICD-10-CM | POA: Diagnosis not present

## 2016-05-22 DIAGNOSIS — N941 Unspecified dyspareunia: Secondary | ICD-10-CM | POA: Diagnosis not present

## 2016-08-14 DIAGNOSIS — N941 Unspecified dyspareunia: Secondary | ICD-10-CM | POA: Diagnosis not present

## 2016-08-14 DIAGNOSIS — G4700 Insomnia, unspecified: Secondary | ICD-10-CM | POA: Diagnosis not present

## 2016-08-14 DIAGNOSIS — M791 Myalgia: Secondary | ICD-10-CM | POA: Diagnosis not present

## 2016-08-14 DIAGNOSIS — F419 Anxiety disorder, unspecified: Secondary | ICD-10-CM | POA: Diagnosis not present

## 2016-08-14 DIAGNOSIS — E785 Hyperlipidemia, unspecified: Secondary | ICD-10-CM | POA: Diagnosis not present

## 2017-04-07 DIAGNOSIS — E785 Hyperlipidemia, unspecified: Secondary | ICD-10-CM | POA: Diagnosis not present

## 2017-04-07 DIAGNOSIS — M791 Myalgia: Secondary | ICD-10-CM | POA: Diagnosis not present

## 2017-04-07 DIAGNOSIS — E559 Vitamin D deficiency, unspecified: Secondary | ICD-10-CM | POA: Diagnosis not present

## 2017-04-07 DIAGNOSIS — G479 Sleep disorder, unspecified: Secondary | ICD-10-CM | POA: Diagnosis not present

## 2017-04-07 DIAGNOSIS — F419 Anxiety disorder, unspecified: Secondary | ICD-10-CM | POA: Diagnosis not present

## 2017-05-10 DIAGNOSIS — R05 Cough: Secondary | ICD-10-CM | POA: Diagnosis not present

## 2017-05-22 ENCOUNTER — Encounter: Payer: Self-pay | Admitting: Internal Medicine

## 2017-06-04 ENCOUNTER — Encounter (INDEPENDENT_AMBULATORY_CARE_PROVIDER_SITE_OTHER): Payer: Self-pay

## 2017-06-04 ENCOUNTER — Encounter: Payer: Self-pay | Admitting: Internal Medicine

## 2017-06-04 ENCOUNTER — Ambulatory Visit: Payer: Medicare HMO | Admitting: Internal Medicine

## 2017-06-04 VITALS — BP 100/60 | HR 78 | Ht 64.0 in | Wt 151.0 lb

## 2017-06-04 DIAGNOSIS — R42 Dizziness and giddiness: Secondary | ICD-10-CM | POA: Diagnosis not present

## 2017-06-04 DIAGNOSIS — I080 Rheumatic disorders of both mitral and aortic valves: Secondary | ICD-10-CM | POA: Diagnosis not present

## 2017-06-04 DIAGNOSIS — R002 Palpitations: Secondary | ICD-10-CM | POA: Diagnosis not present

## 2017-06-04 NOTE — Patient Instructions (Signed)
Medication Instructions: Your physician recommends that you continue on your current medications as directed. Please refer to the Current Medication list given to you today.  Labwork: None Ordered  Procedures/Testing: None Ordered  Follow-Up: Your physician wants you to follow-up in: 2 YEARS with Dr. Caryl Comes. You will receive a reminder letter in the mail two months in advance. If you don't receive a letter, please call our office to schedule the follow-up appointment.   Any Additional Special Instructions Will Be Listed Below (If Applicable).     If you need a refill on your cardiac medications before your next appointment, please call your pharmacy.

## 2017-06-04 NOTE — Progress Notes (Signed)
Patient Care Team: Orpah Melter, MD as PCP - General (Family Medicine) Deboraha Sprang, MD as Consulting Physician (Cardiology)   HPI  [name] is a 61 y.o. female seen in followup for recurrent spells of lightheadedness and dizziness. She has seen multiple physicians over the years for this with varying diagnoses. Most recently she was at [facility] for evaluation of "spells". Neurology service probably thought that these were"all in her head".    She continues to do well   She struggles with dizziness but is exercising.  She is fluid and salt replete  Her daughter miscarried the other day    Past Medical History:  Diagnosis Date  . Complication of anesthesia    low to wake up  . Depression   . Dyspnea   . Fatigue   . GERD (gastroesophageal reflux disease)   . IBS (irritable bowel syndrome)   . Migraine headache   . Mitral valve prolapse    questional Hx  . Moderate mitral regurgitation   . Orthostatic hypotension   . Paralysis ([place])    temporary  . Right hand fracture   . Supraventricular tachycardia ([place])   . Tuberculosis of spine ([place])   . Vitamin B12 deficiency    mild  . Wears glasses     Past Surgical History:  Procedure Laterality Date  . CARDIAC CATHETERIZATION  11/25/2006   Dr. Alonza Bogus attempt  . COLONOSCOPY    . nasal septal surgery  1985  . OPEN REDUCTION INTERNAL FIXATION (ORIF) DISTAL RADIAL FRACTURE Left 11/06/2014   Procedure: OPEN REDUCTION INTERNAL FIXATION (ORIF) LEFT DISTAL RADIAL FRACTURE;  Surgeon: Milly Jakob, MD;  Location: [facility];  Service: Orthopedics;  Laterality: Left;  ANESTHESIA:  GENERAL, PRE/OP BLOCK  . right hand surgery  1980   rt thumb fx  . TUBAL LIGATION     bilateral    Current Outpatient Medications  Medication Sig Dispense Refill  . FLUoxetine (PROZAC) 10 MG tablet Take 10 mg by mouth daily.    Marland Kitchen gabapentin (NEURONTIN) 800 MG tablet Take 800 mg by mouth 2 (two) times  daily.     . propranolol (INDERAL) 20 MG tablet Take 10 mg by mouth as needed (bp).   0  . zolpidem (AMBIEN) 10 MG tablet Take 10 mg by mouth at bedtime as needed.      No current facility-administered medications for this visit.     Allergies  Allergen Reactions  . Erythromycin     REACTION: vomiting  . Erythromycin Base Nausea And Vomiting      Review of Systems negative except from HPI and PMH  Physical Exam BP 100/60   Pulse 78   Ht 5\' 4"  (1.626 m)   Wt 151 lb (68.5 kg)   SpO2 96%   BMI 25.92 kg/m  Well developed and nourished in no acute distress HENT normal Neck supple with JVP-flat Clear Regular rate and rhythm, no murmurs or gallops Abd-soft with active BS No Clubbing cyanosis edema Skin-warm and dry A & Oriented  Grossly normal sensory and motor function     echocardiogram dated today demonstrates sinus rhythm at 78 16/09/40 Axis is 75  Assessment and  Plan  Palpitations  Intermittent hypotension  Dysautonomia  Mitral regurgitation-mild   Overall doing very well  Encourage sodium intake and increased exercise  Will repeat echo in 2 yrs  We spent more than 50% of our >25 min visit in face to face counseling  regarding the above

## 2017-07-31 DIAGNOSIS — H40013 Open angle with borderline findings, low risk, bilateral: Secondary | ICD-10-CM | POA: Diagnosis not present

## 2017-07-31 DIAGNOSIS — H2513 Age-related nuclear cataract, bilateral: Secondary | ICD-10-CM | POA: Diagnosis not present

## 2017-07-31 DIAGNOSIS — H21233 Degeneration of iris (pigmentary), bilateral: Secondary | ICD-10-CM | POA: Diagnosis not present

## 2017-07-31 DIAGNOSIS — H35363 Drusen (degenerative) of macula, bilateral: Secondary | ICD-10-CM | POA: Diagnosis not present

## 2017-10-02 DIAGNOSIS — G4700 Insomnia, unspecified: Secondary | ICD-10-CM | POA: Diagnosis not present

## 2017-10-02 DIAGNOSIS — E785 Hyperlipidemia, unspecified: Secondary | ICD-10-CM | POA: Diagnosis not present

## 2017-10-02 DIAGNOSIS — F419 Anxiety disorder, unspecified: Secondary | ICD-10-CM | POA: Diagnosis not present

## 2017-10-02 DIAGNOSIS — I471 Supraventricular tachycardia: Secondary | ICD-10-CM | POA: Diagnosis not present

## 2017-11-06 DIAGNOSIS — H21233 Degeneration of iris (pigmentary), bilateral: Secondary | ICD-10-CM | POA: Diagnosis not present

## 2017-11-06 DIAGNOSIS — H04123 Dry eye syndrome of bilateral lacrimal glands: Secondary | ICD-10-CM | POA: Diagnosis not present

## 2017-11-06 DIAGNOSIS — H40013 Open angle with borderline findings, low risk, bilateral: Secondary | ICD-10-CM | POA: Diagnosis not present

## 2018-03-17 DIAGNOSIS — M9904 Segmental and somatic dysfunction of sacral region: Secondary | ICD-10-CM | POA: Diagnosis not present

## 2018-03-17 DIAGNOSIS — M9903 Segmental and somatic dysfunction of lumbar region: Secondary | ICD-10-CM | POA: Diagnosis not present

## 2018-03-17 DIAGNOSIS — M5136 Other intervertebral disc degeneration, lumbar region: Secondary | ICD-10-CM | POA: Diagnosis not present

## 2018-03-17 DIAGNOSIS — M9901 Segmental and somatic dysfunction of cervical region: Secondary | ICD-10-CM | POA: Diagnosis not present

## 2018-03-17 DIAGNOSIS — M9902 Segmental and somatic dysfunction of thoracic region: Secondary | ICD-10-CM | POA: Diagnosis not present

## 2018-03-19 DIAGNOSIS — M9901 Segmental and somatic dysfunction of cervical region: Secondary | ICD-10-CM | POA: Diagnosis not present

## 2018-03-19 DIAGNOSIS — M5136 Other intervertebral disc degeneration, lumbar region: Secondary | ICD-10-CM | POA: Diagnosis not present

## 2018-03-19 DIAGNOSIS — M9903 Segmental and somatic dysfunction of lumbar region: Secondary | ICD-10-CM | POA: Diagnosis not present

## 2018-03-19 DIAGNOSIS — M9904 Segmental and somatic dysfunction of sacral region: Secondary | ICD-10-CM | POA: Diagnosis not present

## 2018-03-19 DIAGNOSIS — M9902 Segmental and somatic dysfunction of thoracic region: Secondary | ICD-10-CM | POA: Diagnosis not present

## 2018-04-13 DIAGNOSIS — E785 Hyperlipidemia, unspecified: Secondary | ICD-10-CM | POA: Diagnosis not present

## 2018-04-13 DIAGNOSIS — F419 Anxiety disorder, unspecified: Secondary | ICD-10-CM | POA: Diagnosis not present

## 2018-04-13 DIAGNOSIS — M791 Myalgia, unspecified site: Secondary | ICD-10-CM | POA: Diagnosis not present

## 2018-04-13 DIAGNOSIS — I471 Supraventricular tachycardia: Secondary | ICD-10-CM | POA: Diagnosis not present

## 2018-04-13 DIAGNOSIS — G4700 Insomnia, unspecified: Secondary | ICD-10-CM | POA: Diagnosis not present

## 2018-08-05 DIAGNOSIS — J0100 Acute maxillary sinusitis, unspecified: Secondary | ICD-10-CM | POA: Diagnosis not present

## 2018-08-05 DIAGNOSIS — R05 Cough: Secondary | ICD-10-CM | POA: Diagnosis not present

## 2018-12-09 DIAGNOSIS — G4700 Insomnia, unspecified: Secondary | ICD-10-CM | POA: Diagnosis not present

## 2018-12-09 DIAGNOSIS — F419 Anxiety disorder, unspecified: Secondary | ICD-10-CM | POA: Diagnosis not present

## 2019-04-07 ENCOUNTER — Other Ambulatory Visit: Payer: Self-pay

## 2019-04-07 DIAGNOSIS — Z20822 Contact with and (suspected) exposure to covid-19: Secondary | ICD-10-CM

## 2019-04-07 DIAGNOSIS — R6889 Other general symptoms and signs: Secondary | ICD-10-CM | POA: Diagnosis not present

## 2019-04-09 LAB — NOVEL CORONAVIRUS, NAA: SARS-CoV-2, NAA: NOT DETECTED

## 2019-05-24 DIAGNOSIS — G479 Sleep disorder, unspecified: Secondary | ICD-10-CM | POA: Diagnosis not present

## 2019-05-24 DIAGNOSIS — F419 Anxiety disorder, unspecified: Secondary | ICD-10-CM | POA: Diagnosis not present

## 2019-12-05 DIAGNOSIS — G4700 Insomnia, unspecified: Secondary | ICD-10-CM | POA: Diagnosis not present

## 2020-01-08 ENCOUNTER — Other Ambulatory Visit: Payer: Self-pay

## 2020-01-08 ENCOUNTER — Encounter (HOSPITAL_COMMUNITY): Payer: Self-pay

## 2020-01-08 ENCOUNTER — Emergency Department (HOSPITAL_COMMUNITY): Payer: Medicare HMO

## 2020-01-08 ENCOUNTER — Observation Stay (HOSPITAL_COMMUNITY)
Admission: EM | Admit: 2020-01-08 | Discharge: 2020-01-09 | Disposition: A | Discharge status: Home / Self Care | Payer: Medicare HMO | Attending: Internal Medicine | Admitting: Internal Medicine

## 2020-01-08 DIAGNOSIS — I951 Orthostatic hypotension: Secondary | ICD-10-CM | POA: Insufficient documentation

## 2020-01-08 DIAGNOSIS — Z20822 Contact with and (suspected) exposure to covid-19: Secondary | ICD-10-CM | POA: Insufficient documentation

## 2020-01-08 DIAGNOSIS — R0600 Dyspnea, unspecified: Secondary | ICD-10-CM | POA: Diagnosis not present

## 2020-01-08 DIAGNOSIS — I471 Supraventricular tachycardia: Secondary | ICD-10-CM | POA: Diagnosis not present

## 2020-01-08 DIAGNOSIS — Z79899 Other long term (current) drug therapy: Secondary | ICD-10-CM | POA: Insufficient documentation

## 2020-01-08 DIAGNOSIS — Z8349 Family history of other endocrine, nutritional and metabolic diseases: Secondary | ICD-10-CM | POA: Diagnosis not present

## 2020-01-08 DIAGNOSIS — J449 Chronic obstructive pulmonary disease, unspecified: Secondary | ICD-10-CM | POA: Insufficient documentation

## 2020-01-08 DIAGNOSIS — Z8249 Family history of ischemic heart disease and other diseases of the circulatory system: Secondary | ICD-10-CM | POA: Insufficient documentation

## 2020-01-08 DIAGNOSIS — I491 Atrial premature depolarization: Secondary | ICD-10-CM | POA: Insufficient documentation

## 2020-01-08 DIAGNOSIS — R0602 Shortness of breath: Secondary | ICD-10-CM | POA: Diagnosis not present

## 2020-01-08 DIAGNOSIS — R531 Weakness: Secondary | ICD-10-CM | POA: Diagnosis not present

## 2020-01-08 DIAGNOSIS — F419 Anxiety disorder, unspecified: Secondary | ICD-10-CM | POA: Insufficient documentation

## 2020-01-08 DIAGNOSIS — K219 Gastro-esophageal reflux disease without esophagitis: Secondary | ICD-10-CM | POA: Insufficient documentation

## 2020-01-08 DIAGNOSIS — I081 Rheumatic disorders of both mitral and tricuspid valves: Secondary | ICD-10-CM | POA: Diagnosis not present

## 2020-01-08 DIAGNOSIS — R079 Chest pain, unspecified: Secondary | ICD-10-CM | POA: Diagnosis not present

## 2020-01-08 DIAGNOSIS — R011 Cardiac murmur, unspecified: Secondary | ICD-10-CM | POA: Insufficient documentation

## 2020-01-08 DIAGNOSIS — G43909 Migraine, unspecified, not intractable, without status migrainosus: Secondary | ICD-10-CM | POA: Diagnosis not present

## 2020-01-08 DIAGNOSIS — R072 Precordial pain: Secondary | ICD-10-CM | POA: Diagnosis not present

## 2020-01-08 DIAGNOSIS — I959 Hypotension, unspecified: Secondary | ICD-10-CM | POA: Diagnosis not present

## 2020-01-08 DIAGNOSIS — K589 Irritable bowel syndrome without diarrhea: Secondary | ICD-10-CM | POA: Insufficient documentation

## 2020-01-08 DIAGNOSIS — E538 Deficiency of other specified B group vitamins: Secondary | ICD-10-CM | POA: Insufficient documentation

## 2020-01-08 DIAGNOSIS — E785 Hyperlipidemia, unspecified: Secondary | ICD-10-CM | POA: Insufficient documentation

## 2020-01-08 DIAGNOSIS — R42 Dizziness and giddiness: Secondary | ICD-10-CM | POA: Diagnosis not present

## 2020-01-08 DIAGNOSIS — R110 Nausea: Secondary | ICD-10-CM | POA: Diagnosis not present

## 2020-01-08 DIAGNOSIS — F329 Major depressive disorder, single episode, unspecified: Secondary | ICD-10-CM | POA: Insufficient documentation

## 2020-01-08 DIAGNOSIS — R0789 Other chest pain: Secondary | ICD-10-CM | POA: Diagnosis not present

## 2020-01-08 DIAGNOSIS — I2510 Atherosclerotic heart disease of native coronary artery without angina pectoris: Secondary | ICD-10-CM | POA: Diagnosis not present

## 2020-01-08 DIAGNOSIS — R002 Palpitations: Secondary | ICD-10-CM | POA: Diagnosis not present

## 2020-01-08 DIAGNOSIS — Z791 Long term (current) use of non-steroidal anti-inflammatories (NSAID): Secondary | ICD-10-CM | POA: Insufficient documentation

## 2020-01-08 DIAGNOSIS — Z881 Allergy status to other antibiotic agents status: Secondary | ICD-10-CM | POA: Insufficient documentation

## 2020-01-08 LAB — CBC
HCT: 44.3 % (ref 36.0–46.0)
Hemoglobin: 14.3 g/dL (ref 12.0–15.0)
MCH: 32.1 pg (ref 26.0–34.0)
MCHC: 32.3 g/dL (ref 30.0–36.0)
MCV: 99.6 fL (ref 80.0–100.0)
Platelets: 401 10*3/uL — ABNORMAL HIGH (ref 150–400)
RBC: 4.45 MIL/uL (ref 3.87–5.11)
RDW: 12.8 % (ref 11.5–15.5)
WBC: 4.8 10*3/uL (ref 4.0–10.5)
nRBC: 0 % (ref 0.0–0.2)

## 2020-01-08 LAB — LIPID PANEL
Cholesterol: 209 mg/dL — ABNORMAL HIGH (ref 0–200)
HDL: 48 mg/dL (ref >40)
LDL Cholesterol: 142 mg/dL — ABNORMAL HIGH (ref 0–99)
Total CHOL/HDL Ratio: 4.4 RATIO
Triglycerides: 97 mg/dL (ref <150)
VLDL: 19 mg/dL (ref 0–40)

## 2020-01-08 LAB — BASIC METABOLIC PANEL WITH GFR
Anion gap: 9 (ref 5–15)
BUN: 7 mg/dL — ABNORMAL LOW (ref 8–23)
CO2: 23 mmol/L (ref 22–32)
Calcium: 9.4 mg/dL (ref 8.9–10.3)
Chloride: 109 mmol/L (ref 98–111)
Creatinine, Ser: 0.8 mg/dL (ref 0.44–1.00)
GFR calc Af Amer: >60 mL/min (ref >60)
GFR calc non Af Amer: >60 mL/min (ref >60)
Glucose, Bld: 95 mg/dL (ref 70–99)
Potassium: 3.7 mmol/L (ref 3.5–5.1)
Sodium: 141 mmol/L (ref 135–145)

## 2020-01-08 LAB — TROPONIN I (HIGH SENSITIVITY)
Troponin I (High Sensitivity): <2 ng/L (ref <18)
Troponin I (High Sensitivity): 3 ng/L (ref <18)

## 2020-01-08 LAB — TSH: TSH: 1.882 u[IU]/mL (ref 0.350–4.500)

## 2020-01-08 LAB — HIV ANTIBODY (ROUTINE TESTING W REFLEX): HIV Screen 4th Generation wRfx: NONREACTIVE

## 2020-01-08 LAB — SARS CORONAVIRUS 2 BY RT PCR (HOSPITAL ORDER, PERFORMED IN ~~LOC~~ HOSPITAL LAB): SARS Coronavirus 2: NEGATIVE

## 2020-01-08 MED ORDER — SODIUM CHLORIDE 0.9% FLUSH
3.0000 mL | Freq: Once | INTRAVENOUS | Status: AC
  Administered 2020-01-08: 3 mL via INTRAVENOUS

## 2020-01-08 MED ORDER — GABAPENTIN 400 MG PO CAPS
800.0000 mg | ORAL_CAPSULE | Freq: Two times a day (BID) | ORAL | Status: DC
  Administered 2020-01-08 – 2020-01-09 (×2): 800 mg via ORAL
  Filled 2020-01-08 (×2): qty 2

## 2020-01-08 MED ORDER — ACETAMINOPHEN 325 MG PO TABS: 650.0000 mg | ORAL_TABLET | ORAL | Status: DC | PRN

## 2020-01-08 MED ORDER — ASPIRIN EC 81 MG PO TBEC
81.0000 mg | DELAYED_RELEASE_TABLET | Freq: Every day | ORAL | Status: DC
  Administered 2020-01-08 – 2020-01-09 (×2): 81 mg via ORAL
  Filled 2020-01-08 (×2): qty 1

## 2020-01-08 MED ORDER — IBUPROFEN 400 MG PO TABS: 400.0000 mg | ORAL_TABLET | Freq: Four times a day (QID) | ORAL | Status: DC | PRN

## 2020-01-08 MED ORDER — GABAPENTIN 800 MG PO TABS
800.0000 mg | ORAL_TABLET | Freq: Two times a day (BID) | ORAL | Status: DC
  Filled 2020-01-08: qty 1

## 2020-01-08 MED ORDER — LORAZEPAM 0.5 MG PO TABS: 0.5000 mg | ORAL_TABLET | Freq: Every day | ORAL | Status: DC | PRN

## 2020-01-08 MED ORDER — FLUOXETINE HCL 20 MG PO TABS: 10.0000 mg | ORAL_TABLET | Freq: Every day | ORAL | Status: DC

## 2020-01-08 MED ORDER — ONDANSETRON HCL 4 MG/2ML IJ SOLN: 4.0000 mg | Freq: Four times a day (QID) | INTRAMUSCULAR | Status: DC | PRN

## 2020-01-08 MED ORDER — ZOLPIDEM TARTRATE 5 MG PO TABS
10.0000 mg | ORAL_TABLET | Freq: Every evening | ORAL | Status: DC | PRN
  Administered 2020-01-08: 10 mg via ORAL
  Filled 2020-01-08: qty 2

## 2020-01-08 MED ORDER — FLUOXETINE HCL 20 MG PO CAPS
20.0000 mg | ORAL_CAPSULE | Freq: Every day | ORAL | Status: DC
  Administered 2020-01-08 – 2020-01-09 (×2): 20 mg via ORAL
  Filled 2020-01-08 (×2): qty 1

## 2020-01-08 MED ORDER — PROPRANOLOL HCL 10 MG PO TABS: 10.0000 mg | ORAL_TABLET | ORAL | Status: DC | PRN

## 2020-01-08 NOTE — ED Provider Notes (Signed)
[facility] EMERGENCY DEPARTMENT Provider Note   CSN: [id number] Arrival date & time: 01/08/20  1243     History Chief Complaint  Patient presents with  . Chest Pain    [name] is a 64 y.o. female.  64 year old female presents with substernal chest pain that radiates to her back which began today.  Patient states she had pressure that was persistent but then gradually turned into pain.  Was dyspneic but not diaphoretic.  No nausea or vomiting.  Went to urgent care and they called EMS.  Upon EMS arrival her symptoms became worse and was given nitroglycerin with complete relief of her symptoms.  Denies any recent fever or chills.  No cough or congestion.  She is currently asymptomatic        Past Medical History:  Diagnosis Date  . Complication of anesthesia    low to wake up  . Depression   . Dyspnea   . Fatigue   . GERD (gastroesophageal reflux disease)   . IBS (irritable bowel syndrome)   . Migraine headache   . Mitral valve prolapse    questional Hx  . Moderate mitral regurgitation   . Orthostatic hypotension   . Paralysis ([place])    temporary  . Right hand fracture   . Supraventricular tachycardia ([place])   . Tuberculosis of spine ([place])   . Vitamin B12 deficiency    mild  . Wears glasses     Patient Active Problem List   Diagnosis Date Noted  . Palpitations 08/19/2011  . HYPOTENSION, UNSPECIFIED 05/30/2010  . DYSAUTONOMIA 05/30/2010  . MIGRAINE HEADACHE 06/04/2007  . MITRAL REGURGITATION 06/04/2007  . FATIGUE, CHRONIC 06/04/2007  . DYSPNEA 06/04/2007    Past Surgical History:  Procedure Laterality Date  . CARDIAC CATHETERIZATION  11/25/2006   Dr. Alonza Bogus attempt  . COLONOSCOPY    . nasal septal surgery  1985  . OPEN REDUCTION INTERNAL FIXATION (ORIF) DISTAL RADIAL FRACTURE Left 11/06/2014   Procedure: OPEN REDUCTION INTERNAL FIXATION (ORIF) LEFT DISTAL RADIAL FRACTURE;  Surgeon: Milly Jakob, MD;  Location: [facility];  Service: Orthopedics;  Laterality: Left;  ANESTHESIA:  GENERAL, PRE/OP BLOCK  . right hand surgery  1980   rt thumb fx  . TUBAL LIGATION     bilateral     OB History   No obstetric history on file.     Family History  Problem Relation Age of Onset  . Hypertension Mother 53  . Hypertension Father 3  . Hyperlipidemia Father   . Atrial fibrillation Father     Social History   Tobacco Use  . Smoking status: Never Smoker  . Smokeless tobacco: Never Used  Vaping Use  . Vaping Use: Never used  Substance Use Topics  . Alcohol use: Yes    Comment: rare  . Drug use: No    Home Medications Prior to Admission medications   Medication Sig Start Date End Date Taking? Authorizing Provider  FLUoxetine (PROZAC) 10 MG tablet Take 10 mg by mouth daily.    [provider]  gabapentin (NEURONTIN) 800 MG tablet Take 800 mg by mouth 2 (two) times daily.  08/12/11   [provider]  propranolol (INDERAL) 20 MG tablet Take 10 mg by mouth as needed (bp).  08/19/11   Deboraha Sprang, MD  zolpidem (AMBIEN) 10 MG tablet Take 10 mg by mouth at bedtime as needed.  07/25/11   [provider]    Allergies  Erythromycin and Erythromycin base  Review of Systems   Review of Systems  All other systems reviewed and are negative.   Physical Exam Updated Vital Signs BP 121/61 (BP Location: Right Arm)   Pulse 62   Temp 98.5 F (36.9 C) (Oral)   Resp 14   Wt 61.7 kg   SpO2 100%   BMI 23.34 kg/m   Physical Exam Vitals and nursing note reviewed.  Constitutional:      General: She is not in acute distress.    Appearance: Normal appearance. She is well-developed. She is not toxic-appearing.  HENT:     Head: Normocephalic and atraumatic.  Eyes:     General: Lids are normal.     Conjunctiva/sclera: Conjunctivae normal.     Pupils: Pupils are equal, round, and reactive to light.  Neck:     Thyroid: No thyroid mass.     Trachea: No tracheal  deviation.  Cardiovascular:     Rate and Rhythm: Normal rate and regular rhythm.     Heart sounds: Normal heart sounds. No murmur heard.  No gallop.   Pulmonary:     Effort: Pulmonary effort is normal. No respiratory distress.     Breath sounds: Normal breath sounds. No stridor. No decreased breath sounds, wheezing, rhonchi or rales.  Abdominal:     General: Bowel sounds are normal. There is no distension.     Palpations: Abdomen is soft.     Tenderness: There is no abdominal tenderness. There is no rebound.  Musculoskeletal:        General: No tenderness. Normal range of motion.     Cervical back: Normal range of motion and neck supple.  Skin:    General: Skin is warm and dry.     Findings: No abrasion or rash.  Neurological:     Mental Status: She is alert and oriented to person, place, and time.     GCS: GCS eye subscore is 4. GCS verbal subscore is 5. GCS motor subscore is 6.     Cranial Nerves: No cranial nerve deficit.     Sensory: No sensory deficit.  Psychiatric:        Speech: Speech normal.        Behavior: Behavior normal.     ED Results / Procedures / Treatments   Labs (all labs ordered are listed, but only abnormal results are displayed) Labs Reviewed  BASIC METABOLIC PANEL - Abnormal; Notable for the following components:      Result Value   BUN 7 (*)    All other components within normal limits  CBC - Abnormal; Notable for the following components:   Platelets 401 (*)    All other components within normal limits  SARS CORONAVIRUS 2 BY RT PCR (HOSPITAL ORDER, [facility])  TROPONIN I (HIGH SENSITIVITY)  TROPONIN I (HIGH SENSITIVITY)    EKG EKG Interpretation  Date/Time:  Sunday January 08 2020 14:46:34 EDT Ventricular Rate:  60 PR Interval:    QRS Duration: 90 QT Interval:  458 QTC Calculation: 458 R Axis:   70 Text Interpretation: Normal sinus rhythm Low voltage, extremity leads Nonspecific T abnormalities, lateral leads No  significant change since last tracing Confirmed by Lacretia Leigh ([id number]) on 01/08/2020 3:01:23 PM   Radiology DG Chest 2 View  Result Date: 01/08/2020 CLINICAL DATA:  Chest pain. EXAM: CHEST - 2 VIEW COMPARISON:  May 10, 2017 FINDINGS: The heart, hila, and mediastinum are normal. No pneumothorax. No nodules or  masses. No focal infiltrates. No acute abnormalities are identified. IMPRESSION: No active cardiopulmonary disease. Electronically Signed   By: Dorise Bullion III M.D   On: 01/08/2020 13:32    Procedures Procedures (including critical care time)  Medications Ordered in ED Medications  sodium chloride flush (NS) 0.9 % injection 3 mL (has no administration in time range)    ED Course  I have reviewed the triage vital signs and the nursing notes.  Pertinent labs & imaging results that were available during my care of the patient were reviewed by me and considered in my medical decision making (see chart for details).    MDM Rules/Calculators/A&P                          Patient is EKG without acute ischemic changes.  Was given aspirin by EMS.  Is pain-free at this time.  For troponin is negative.  Chest x-ray without acute findings.  Will consult hospitalist for admission Final Clinical Impression(s) / ED Diagnoses Final diagnoses:  None    Rx / DC Orders ED Discharge Orders    None       Lacretia Leigh, MD 01/08/20 1521

## 2020-01-08 NOTE — H&P (Signed)
History and Physical    [name]:[id number] DOB: Aug 31, 1955 DOA: 01/08/2020  PCP: Orpah Melter, MD (Confirm with patient/family/NH records and if not entered, this has to be entered at [facility] point of entry) Patient coming from: Home  I have personally briefly reviewed patient's old medical records in [place]  Chief Complaint: Chest pain  HPI: [name] is a 64 y.o. female with medical history significant of paroxysmal SVT, mitral regurgitation, anxiety/depression, scented with chest pains.  Symptoms started this morning when woke up, pressure-like, radiated to left shoulder, associated with feeling of lightheaded but no sweating no palpitations.  At one point, he feels like about to pass out. Worsening with deep breath and chest movement, no relieving factors until Nitro given by EMS.  Patient reported that she had similar episodes every once a while for last  3-4 years for which she has been following cardiologist Dr. Caryl Comes, who offered her stress test and echocardiogram every 2 to 3 years and she was last seen by Dr. Caryl Comes about 3 years ago reported her all her past stress tests were normal.  ED Course: First set troponin negative  Review of Systems: As per HPI otherwise 10 point review of systems negative.    Past Medical History:  Diagnosis Date  . Complication of anesthesia    low to wake up  . Depression   . Dyspnea   . Fatigue   . GERD (gastroesophageal reflux disease)   . IBS (irritable bowel syndrome)   . Migraine headache   . Mitral valve prolapse    questional Hx  . Moderate mitral regurgitation   . Orthostatic hypotension   . Paralysis ([place])    temporary  . Right hand fracture   . Supraventricular tachycardia ([place])   . Tuberculosis of spine ([place])   . Vitamin B12 deficiency    mild  . Wears glasses     Past Surgical History:  Procedure Laterality Date  . CARDIAC CATHETERIZATION  11/25/2006   Dr. Alonza Bogus attempt  .  COLONOSCOPY    . nasal septal surgery  1985  . OPEN REDUCTION INTERNAL FIXATION (ORIF) DISTAL RADIAL FRACTURE Left 11/06/2014   Procedure: OPEN REDUCTION INTERNAL FIXATION (ORIF) LEFT DISTAL RADIAL FRACTURE;  Surgeon: Milly Jakob, MD;  Location: [facility];  Service: Orthopedics;  Laterality: Left;  ANESTHESIA:  GENERAL, PRE/OP BLOCK  . right hand surgery  1980   rt thumb fx  . TUBAL LIGATION     bilateral     reports that she has never smoked. She has never used smokeless tobacco. She reports current alcohol use. She reports that she does not use drugs.  Allergies  Allergen Reactions  . Erythromycin     REACTION: vomiting  . Erythromycin Base Nausea And Vomiting    Family History  Problem Relation Age of Onset  . Hypertension Mother 55  . Hypertension Father 38  . Hyperlipidemia Father   . Atrial fibrillation Father      Prior to Admission medications   Medication Sig Start Date End Date Taking? Authorizing Provider  FLUoxetine (PROZAC) 10 MG tablet Take 10 mg by mouth daily.    [provider]  gabapentin (NEURONTIN) 800 MG tablet Take 800 mg by mouth 2 (two) times daily.  08/12/11   [provider]  propranolol (INDERAL) 20 MG tablet Take 10 mg by mouth as needed (bp).  08/19/11   Deboraha Sprang, MD  zolpidem (AMBIEN) 10 MG tablet Take 10  mg by mouth at bedtime as needed.  07/25/11   [provider]    Physical Exam: Vitals:   01/08/20 1446 01/08/20 1500 01/08/20 1515 01/08/20 1530  BP: 121/61 136/73 136/78 128/70  Pulse: 62 66 64 60  Resp: 14 18 18 11   Temp:      TempSrc:      SpO2: 100% 100% 100% 100%  Weight:        Constitutional: NAD, calm, comfortable Vitals:   01/08/20 1446 01/08/20 1500 01/08/20 1515 01/08/20 1530  BP: 121/61 136/73 136/78 128/70  Pulse: 62 66 64 60  Resp: 14 18 18 11   Temp:      TempSrc:      SpO2: 100% 100% 100% 100%  Weight:       Eyes: PERRL, lids and conjunctivae normal ENMT: Mucous  membranes are moist. Posterior pharynx clear of any exudate or lesions.Normal dentition.  Neck: normal, supple, no masses, no thyromegaly Respiratory: clear to auscultation bilaterally, no wheezing, no crackles. Normal respiratory effort. No accessory muscle use.  Cardiovascular: Regular rate and rhythm, soft systolic murmur on heart apex. No extremity edema. 2+ pedal pulses. No carotid bruits.  Abdomen: no tenderness, no masses palpated. No hepatosplenomegaly. Bowel sounds positive.  Musculoskeletal: no clubbing / cyanosis. No joint deformity upper and lower extremities. Good ROM, no contractures. Normal muscle tone.  Skin: no rashes, lesions, ulcers. No induration Neurologic: CN 2-12 grossly intact. Sensation intact, DTR normal. Strength 5/5 in all 4.  Psychiatric: Normal judgment and insight. Alert and oriented x 3. Normal mood.     Labs on Admission: I have personally reviewed following labs and imaging studies  CBC: Recent Labs  Lab 01/08/20 1250  WBC 4.8  HGB 14.3  HCT 44.3  MCV 99.6  PLT 295*   Basic Metabolic Panel: Recent Labs  Lab 01/08/20 1250  NA 141  K 3.7  CL 109  CO2 23  GLUCOSE 95  BUN 7*  CREATININE 0.80  CALCIUM 9.4   GFR: CrCl cannot be calculated (Unknown ideal weight.). Liver Function Tests: No results for input(s): AST, ALT, ALKPHOS, BILITOT, PROT, ALBUMIN in the last 168 hours. No results for input(s): LIPASE, AMYLASE in the last 168 hours. No results for input(s): AMMONIA in the last 168 hours. Coagulation Profile: No results for input(s): INR, PROTIME in the last 168 hours. Cardiac Enzymes: No results for input(s): CKTOTAL, CKMB, CKMBINDEX, TROPONINI in the last 168 hours. BNP (last 3 results) No results for input(s): PROBNP in the last 8760 hours. HbA1C: No results for input(s): HGBA1C in the last 72 hours. CBG: No results for input(s): GLUCAP in the last 168 hours. Lipid Profile: No results for input(s): CHOL, HDL, LDLCALC, TRIG,  CHOLHDL, LDLDIRECT in the last 72 hours. Thyroid Function Tests: No results for input(s): TSH, T4TOTAL, FREET4, T3FREE, THYROIDAB in the last 72 hours. Anemia Panel: No results for input(s): VITAMINB12, FOLATE, FERRITIN, TIBC, IRON, RETICCTPCT in the last 72 hours. Urine analysis: No results found for: COLORURINE, APPEARANCEUR, LABSPEC, [place], GLUCOSEU, HGBUR, BILIRUBINUR, KETONESUR, PROTEINUR, UROBILINOGEN, NITRITE, LEUKOCYTESUR  Radiological Exams on Admission: DG Chest 2 View  Result Date: 01/08/2020 CLINICAL DATA:  Chest pain. EXAM: CHEST - 2 VIEW COMPARISON:  May 10, 2017 FINDINGS: The heart, hila, and mediastinum are normal. No pneumothorax. No nodules or masses. No focal infiltrates. No acute abnormalities are identified. IMPRESSION: No active cardiopulmonary disease. Electronically Signed   By: Dorise Bullion III M.D   On: 01/08/2020 13:32    EKG: Independently reviewed.  Low voltage, no acute ST-T changes otherwise similar to EKG 2 years ago  Assessment/Plan Active Problems:   Chest pain  (please populate well all problems here in Problem List. (For example, if patient is on BP meds at home and you resume or decide to hold them, it is a problem that needs to be her. Same for CAD, COPD, HLD and so on)  Chest pain rule out ACS -We will follow up second set of troponin, ordered echo, also send troponin tomorrow morning -If echo or repeated troponin are abnormal will consider consult cardiology -Otherwise pt agrees to follow Dr. Caryl Comes for outpt stress test. -Start ASA  History of mitral regurgitation and mitral prolapse -According to cardiologist last note 3 years ago, structural issue has been mild, repeat echo, if you see any progress will consider cardiology consult -Continue Inderal  Paroxysmal SVT -Monitoring for 24 hours, then outpatient cardiology follow up for further monitoring as needed  Near syncope, recurrent -Echo and Carotid doppler, Orthostasis -Outpt  Loop since she continue to have similar episodes more than once a year.  Anxiety/Depression -SSRI  DVT prophylaxis: Ambulate ad lib Code Status: Full Family Communication: Husband Disposition Plan: Likely can be discharged within 24 hours once cardiology work-up done consults called: None Admission status: Tele Obs   Lequita Halt MD [facility] Pager [phone]    01/08/2020, 4:02 PM

## 2020-01-08 NOTE — ED Triage Notes (Signed)
Pt presents via [facility] [address], woke up at 0700 w/mid-sternum CP, SOB and dizziness.  324 mg ASA  Nitro x1  BP 125/60 HR 60

## 2020-01-08 NOTE — ED Notes (Signed)
Report attempted x 1

## 2020-01-09 ENCOUNTER — Observation Stay (HOSPITAL_BASED_OUTPATIENT_CLINIC_OR_DEPARTMENT_OTHER): Payer: Medicare HMO

## 2020-01-09 DIAGNOSIS — I361 Nonrheumatic tricuspid (valve) insufficiency: Secondary | ICD-10-CM

## 2020-01-09 DIAGNOSIS — R002 Palpitations: Secondary | ICD-10-CM | POA: Diagnosis not present

## 2020-01-09 DIAGNOSIS — Z20822 Contact with and (suspected) exposure to covid-19: Secondary | ICD-10-CM | POA: Diagnosis not present

## 2020-01-09 DIAGNOSIS — F329 Major depressive disorder, single episode, unspecified: Secondary | ICD-10-CM | POA: Diagnosis not present

## 2020-01-09 DIAGNOSIS — R079 Chest pain, unspecified: Secondary | ICD-10-CM | POA: Diagnosis not present

## 2020-01-09 DIAGNOSIS — I471 Supraventricular tachycardia: Secondary | ICD-10-CM | POA: Diagnosis not present

## 2020-01-09 DIAGNOSIS — R0789 Other chest pain: Secondary | ICD-10-CM

## 2020-01-09 DIAGNOSIS — I340 Nonrheumatic mitral (valve) insufficiency: Secondary | ICD-10-CM | POA: Diagnosis not present

## 2020-01-09 DIAGNOSIS — I959 Hypotension, unspecified: Secondary | ICD-10-CM | POA: Diagnosis not present

## 2020-01-09 DIAGNOSIS — I081 Rheumatic disorders of both mitral and tricuspid valves: Secondary | ICD-10-CM | POA: Diagnosis not present

## 2020-01-09 DIAGNOSIS — G43909 Migraine, unspecified, not intractable, without status migrainosus: Secondary | ICD-10-CM | POA: Diagnosis not present

## 2020-01-09 DIAGNOSIS — F419 Anxiety disorder, unspecified: Secondary | ICD-10-CM | POA: Diagnosis not present

## 2020-01-09 LAB — ECHOCARDIOGRAM COMPLETE
Height: 64 in
Weight: 2176 oz

## 2020-01-09 LAB — TROPONIN I (HIGH SENSITIVITY): Troponin I (High Sensitivity): 3 ng/L (ref <18)

## 2020-01-09 NOTE — Progress Notes (Signed)
°  Echocardiogram 2D Echocardiogram has been performed.  Bobbye Charleston 01/09/2020, 9:51 AM

## 2020-01-09 NOTE — Discharge Summary (Signed)
Physician Discharge Summary  [name] OXB:[id number] DOB: [birth year] DOA: 01/08/2020  PCP: Orpah Melter, MD  Admit date: 01/08/2020 Discharge date: 01/09/2020  Admitted From: home Discharge disposition: home   Recommendations for Outpatient Follow-Up:   1. Follow up with primary cardiologist 1-2 weeks for evaluation of chest pain and review of echo results   Discharge Diagnosis:   Principal Problem:   Chest pain Active Problems:   Hypotension, unspecified   Palpitations    Discharge Condition: Improved.  Diet recommendation: Low sodium, heart healthy.    Wound care: None.  Code status: Full.   History of Present Illness:   [name] is a 64 y.o. female with medical history significant of paroxysmal SVT, mitral regurgitation, anxiety/depression, admitted 01/08/20 with chest pains.  Symptoms started that morning when woke up, pressure-like, radiated to left shoulder, associated with feeling of lightheaded but no sweating no palpitations.  At one point, she felt like she was about to pass out. Worsening with deep breath and chest movement, no relieving factors until Nitro given by EMS.  Patient reported that she had similar episodes about once a while for last  3-4 years for which she has been following cardiologist Dr. Caryl Comes, who offered her stress test and echocardiogram every 2 to 3 years and she was last seen by Dr. Caryl Comes about 3 years ago reported her all her past stress tests were normal. In ED work up reassuring and she was admitted for chest pain rule out.     Hospital Course by Problem:   Chest pain rule out ACS. HS trops negative x3, ekg with NSR. Tele without event. Ambulated in hall without incident. Discharge to home with OP follow up with primary cardiology Dr. Caryl Comes. Chest pain free at discharge.   History of mitral regurgitation and mitral prolapse. According to cardiologist last note 3 years ago, structural issue has been mild. Echo  revealed EF 60-65% with no regional wall motion abnormalities and no LVH, moderate mitral valve regurg no evidence of mitral stenosis.   Paroxysmal SVT. Hx of same. No events on tele. Outpatient cardiology follow up for further monitoring as needed  Near syncope, recurrent. Not orthostatic. Carotid dopplar reveals near normal extracranial vessels on left and right with only minimal thickening or plaque. Echo reveals EF 60-65% with no wall motion abnormalities. Follow up with primary cardiology as noted above.   Anxiety/Depression. Stable at baseline    Medical Consultants:      Discharge Exam:   Vitals:   01/09/20 0807 01/09/20 0810  BP:    Pulse: 81 82  Resp:    Temp:    SpO2:     Vitals:   01/09/20 0805 01/09/20 0806 01/09/20 0807 01/09/20 0810  BP:      Pulse: 69 76 81 82  Resp:      Temp:      TempSrc:      SpO2:      Weight:      Height:        General exam: Appears calm and comfortable. No acute distress Respiratory system: Clear to auscultation. Respiratory effort normal. Cardiovascular system: S1 & S2 heard, RRR. No JVD,  rubs, gallops or clicks. No murmurs. Gastrointestinal system: Abdomen is nondistended, soft and nontender. No organomegaly or masses felt. Normal bowel sounds heard. Central nervous system: Alert and oriented. No focal neurological deficits. Extremities: No clubbing,  or cyanosis. No edema. Skin: No rashes, lesions or ulcers. Psychiatry: Judgement and  insight appear normal. Mood & affect appropriate.    The results of significant diagnostics from this hospitalization (including imaging, microbiology, ancillary and laboratory) are listed below for reference.     Procedures and Diagnostic Studies:   DG Chest 2 View  Result Date: 01/08/2020 CLINICAL DATA:  Chest pain. EXAM: CHEST - 2 VIEW COMPARISON:  May 10, 2017 FINDINGS: The heart, hila, and mediastinum are normal. No pneumothorax. No nodules or masses. No focal infiltrates. No  acute abnormalities are identified. IMPRESSION: No active cardiopulmonary disease. Electronically Signed   By: Dorise Bullion III M.D   On: 01/08/2020 13:32   VAS US CAROTID  Result Date: 01/09/2020 Carotid Arterial Duplex Study Indications:       Near syncope. Comparison Study:  No prior studies. Performing Technologist: Darlin Coco  Examination Guidelines: A complete evaluation includes B-mode imaging, spectral Doppler, color Doppler, and power Doppler as needed of all accessible portions of each vessel. Bilateral testing is considered an integral part of a complete examination. Limited examinations for reoccurring indications may be performed as noted.  Right Carotid Findings: +----------+--------+--------+--------+------------------+------------------+           PSV cm/sEDV cm/sStenosisPlaque DescriptionComments           +----------+--------+--------+--------+------------------+------------------+ CCA Prox  88      26                                intimal thickening +----------+--------+--------+--------+------------------+------------------+ CCA Distal83      23                                                   +----------+--------+--------+--------+------------------+------------------+ ICA Prox  74      26      Normal                    intimal thickening +----------+--------+--------+--------+------------------+------------------+ ICA Distal114     39                                tortuous           +----------+--------+--------+--------+------------------+------------------+ ECA       81      17                                                   +----------+--------+--------+--------+------------------+------------------+ +----------+--------+-------+----------------+-------------------+           PSV cm/sEDV cmsDescribe        Arm Pressure (mmHG) +----------+--------+-------+----------------+-------------------+ [id number]              Multiphasic, WNL                    +----------+--------+-------+----------------+-------------------+ +---------+--------+--+--------+--+---------+ VertebralPSV cm/s53EDV cm/s18Antegrade +---------+--------+--+--------+--+---------+  Left Carotid Findings: +----------+--------+--------+--------+------------------+------------------+           PSV cm/sEDV cm/sStenosisPlaque DescriptionComments           +----------+--------+--------+--------+------------------+------------------+ CCA Prox  87      27  intimal thickening +----------+--------+--------+--------+------------------+------------------+ CCA Distal66      22                                intimal thickening +----------+--------+--------+--------+------------------+------------------+ ICA Prox  63      22      Normal                    intimal thickening +----------+--------+--------+--------+------------------+------------------+ ICA Distal90      39                                                   +----------+--------+--------+--------+------------------+------------------+ ECA       64      14                                                   +----------+--------+--------+--------+------------------+------------------+ +----------+--------+--------+----------------+-------------------+           PSV cm/sEDV cm/sDescribe        Arm Pressure (mmHG) +----------+--------+--------+----------------+-------------------+ [id number]             Multiphasic, WNL                    +----------+--------+--------+----------------+-------------------+ +---------+--------+--+--------+--+---------+ VertebralPSV cm/s53EDV cm/s17Antegrade +---------+--------+--+--------+--+---------+   Summary: Right Carotid: The extracranial vessels were near-normal with only minimal wall                thickening or plaque. Left Carotid: The extracranial vessels were near-normal with  only minimal wall               thickening or plaque. Vertebrals:  Bilateral vertebral arteries demonstrate antegrade flow. Subclavians: Normal flow hemodynamics were seen in bilateral subclavian              arteries. *See table(s) above for measurements and observations.     Preliminary      Labs:   Basic Metabolic Panel: Recent Labs  Lab 01/08/20 1250  NA 141  K 3.7  CL 109  CO2 23  GLUCOSE 95  BUN 7*  CREATININE 0.80  CALCIUM 9.4   GFR Estimated Creatinine Clearance: 63 mL/min (by C-G formula based on SCr of 0.8 mg/dL). Liver Function Tests: No results for input(s): AST, ALT, ALKPHOS, BILITOT, PROT, ALBUMIN in the last 168 hours. No results for input(s): LIPASE, AMYLASE in the last 168 hours. No results for input(s): AMMONIA in the last 168 hours. Coagulation profile No results for input(s): INR, PROTIME in the last 168 hours.  CBC: Recent Labs  Lab 01/08/20 1250  WBC 4.8  HGB 14.3  HCT 44.3  MCV 99.6  PLT 401*   Cardiac Enzymes: No results for input(s): CKTOTAL, CKMB, CKMBINDEX, TROPONINI in the last 168 hours. BNP: Invalid input(s): POCBNP CBG: No results for input(s): GLUCAP in the last 168 hours. D-Dimer No results for input(s): DDIMER in the last 72 hours. Hgb A1c No results for input(s): HGBA1C in the last 72 hours. Lipid Profile Recent Labs    01/08/20 1725  CHOL 209*  HDL 48  LDLCALC 142*  TRIG 97  CHOLHDL 4.4   Thyroid function studies Recent Labs    01/08/20 1725  TSH 1.882   Anemia work up No results for input(s): VITAMINB12, FOLATE, FERRITIN, TIBC, IRON, RETICCTPCT in the last 72 hours. Microbiology Recent Results (from the past 240 hour(s))  SARS Coronavirus 2 by RT PCR (hospital order, performed in [facility] hospital lab) Nasopharyngeal Nasopharyngeal Swab     Status: None   Collection Time: 01/08/20  3:37 PM   Specimen: Nasopharyngeal Swab  Result Value Ref Range Status   SARS Coronavirus 2 NEGATIVE NEGATIVE Final     Comment: (NOTE) SARS-CoV-2 target nucleic acids are NOT DETECTED.  The SARS-CoV-2 RNA is generally detectable in upper and lower respiratory specimens during the acute phase of infection. The lowest concentration of SARS-CoV-2 viral copies this assay can detect is 250 copies / mL. A negative result does not preclude SARS-CoV-2 infection and should not be used as the sole basis for treatment or other patient management decisions.  A negative result may occur with improper specimen collection / handling, submission of specimen other than nasopharyngeal swab, presence of viral mutation(s) within the areas targeted by this assay, and inadequate number of viral copies (<250 copies / mL). A negative result must be combined with clinical observations, patient history, and epidemiological information.  Fact Sheet for Patients:   StrictlyIdeas.no  Fact Sheet for Healthcare Providers: BankingDealers.co.za  This test is not yet approved or  cleared by the [place] FDA and has been authorized for detection and/or diagnosis of SARS-CoV-2 by FDA under an Emergency Use Authorization (EUA).  This EUA will remain in effect (meaning this test can be used) for the duration of the COVID-19 declaration under Section 564(b)(1) of the Act, 21 U.S.C. section 360bbb-3(b)(1), unless the authorization is terminated or revoked sooner.  Performed at [facility], [place] [address] [place], [place] [address]      Discharge Instructions:    Allergies as of 01/09/2020      Reactions   Erythromycin Base Nausea And Vomiting      Medication List    TAKE these medications   FLUoxetine 20 MG capsule Commonly known as: PROZAC Take 20 mg by mouth daily.   gabapentin 800 MG tablet Commonly known as: NEURONTIN Take 1,600 mg by mouth at bedtime.   ibuprofen 200 MG tablet Commonly known as: ADVIL Take 400-600 mg by mouth every 6 (six) hours as needed  for headache (pain).   LORazepam 0.5 MG tablet Commonly known as: ATIVAN Take 0.5 mg by mouth daily as needed (muscle cramps).   propranolol 40 MG tablet Commonly known as: INDERAL Take 40 mg by mouth daily as needed (heart rate 110-120 for more than 10-20 minutes).   zolpidem 10 MG tablet Commonly known as: AMBIEN Take 10 mg by mouth at bedtime as needed for sleep.         Time coordinating discharge: 40 minutes  Signed:  Radene Gunning NP  [facility] 01/09/2020, 10:55 AM

## 2020-01-09 NOTE — Progress Notes (Signed)
Carotid duplex study completed.   See Cv Proc for preliminary results.   Maurisa Tesmer  

## 2020-01-09 NOTE — Progress Notes (Signed)
Discharge instructions (including medications) discussed with and copy provided to patient/caregiver 

## 2020-01-09 NOTE — Plan of Care (Signed)
  Problem: Education: Goal: Knowledge of General Education information will improve Description: Including pain rating scale, medication(s)/side effects and non-pharmacologic comfort measures Outcome: Adequate for Discharge   

## 2020-01-26 NOTE — Progress Notes (Signed)
Cardiology Office Note Date:  01/27/2020  Patient ID:  [name], [name] July 04, 1956, MRN [id number] PCP:  Orpah Melter, MD  Cardiologist:  Dr. Caryl Comes    Chief Complaint:  hospital follow up  History of Present Illness: [name] is a 64 y.o. female with history of SVT, anxiety/depression  She comes in today to be seen for Dr. Caryl Comes, last seen by him (or here at all) Nov 2018.  At that time his assessment was: Palpitations Intermittent hypotension Dysautonomia Mild MR Was doing well overall, planned to update echo in 2 years  She was hospitalized 6/20-21 with c/o CP, near syncope.  H&P noted worsening with deep breath and chest movement, no relieving factorsuntil Nitro given by EMS She was discharged with negative Trop, no recurrent symptoms, TTE noted LVEF 60-65%, no WMA, mod MR, near normal carotid US to follow up with [place].  01/08/20 LABS HS Trop <2, 3, 3 K+ 3.7 BUN/Creat 7/0.8 WBC 4.8 H/H 14/44 Plts 401 TSH 1.882   She has been quite well the last couple years, mentions post menopausal has greatly improved her dysautonomia symptoms and will only occasionally have a weak spell.  She has not fainted in quite a long time.  About 2 weeks ago she started noticing she was having her PVCs, and her HR was widely fluctuating.  She swims regularly for exercise and does a lot of gardening and noted that even a walk around the pool her HR was shooting up to the 120's or so.  Unrelated to this he was feeling chest pressure that was a squeezing sensation seemed like something was pressing on her front to back and back to front as well, (no ripping, tearing, not radiating but squeezing).  This could occur at rest and wax/wane, while having it , did seem to feel worse when she started walking around, though exertional not a particular provoker, no other associated symptoms  No change to her exertional capacity.  No change in medicines, life events or otherwise of late.  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  She hs Inderal 40mg  PRN, she reports the same Rx for many years, has not used it for years but historically when her HR >130 she would.  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 Alonza Bogus attempt   COLONOSCOPY     nasal septal surgery  1985   OPEN REDUCTION INTERNAL FIXATION (ORIF) DISTAL RADIAL FRACTURE Left 11/06/2014   Procedure: OPEN REDUCTION INTERNAL FIXATION (ORIF) LEFT DISTAL RADIAL FRACTURE;  Surgeon: Milly Jakob, MD;  Location: [facility];  Service: Orthopedics;  Laterality: Left;  ANESTHESIA:  GENERAL, PRE/OP BLOCK   right hand surgery  1980  rt thumb fx   TUBAL LIGATION     bilateral    Current Outpatient Medications  Medication Sig Dispense Refill   aspirin 81 MG EC tablet Take 81 mg by mouth daily. Swallow whole.     FLUoxetine (PROZAC) 20 MG capsule Take 20 mg by mouth daily.     gabapentin (NEURONTIN) 800 MG tablet Take 1,600 mg by mouth at bedtime.      ibuprofen (ADVIL) 200 MG tablet Take 400-600 mg by mouth every 6 (six) hours as needed for headache (pain).     LORazepam (ATIVAN) 0.5 MG tablet Take 0.5 mg by mouth daily as needed (muscle cramps).     propranolol (INDERAL) 40 MG tablet Take 1 tablet (40 mg total) by mouth daily as needed  (heart rate 110-120 for more than 10-20 minutes). 90 tablet 1   zolpidem (AMBIEN) 10 MG tablet Take 10 mg by mouth at bedtime as needed for sleep.      No current facility-administered medications for this visit.    Allergies:   Erythromycin base   Social History:  The patient  reports that she has never smoked. She has never used smokeless tobacco. She reports current alcohol use. She reports that she does not use drugs.   Family History:  The patient's family history includes Atrial fibrillation in her father; Hyperlipidemia in her father; Hypertension (age of onset: 59) in her mother; Hypertension (age of onset: 62) in her father.  ROS:  Please see the history of present illness.  All other systems are reviewed and otherwise negative.   PHYSICAL EXAM: VS:  BP 106/72    Pulse 76    Ht 5\' 4"  (1.626 m)    Wt 137 lb (62.1 kg)    BMI 23.52 kg/m  BMI: Body mass index is 23.52 kg/m. Well nourished, well developed, in no acute distress  HEENT: normocephalic, atraumatic  Neck: no JVD, carotid bruits or masses Cardiac:  RRR; no significant murmurs, no rubs, or gallops Lungs:  CTA b/l, no wheezing, rhonchi or rales  Abd: soft, nontender MS: no deformity or atrophy Ext:  no edema  Skin: warm and dry, no rash Neuro:  No gross deficits appreciated Psych: euthymic mood, full affect     EKG:   Personally reviewed: 01/08/20: SR  71bpm, PAC, normal otherwise #2 SR 60bpm, low voltage limb leads 01/09/20: SR, 72bp, normal  01/09/2020: Carotid US Summary:  Right Carotid: The extracranial vessels were near-normal with only minimal  wall         thickening or plaque.   Left Carotid: The extracranial vessels were near-normal with only minimal  wall        thickening or plaque.   Vertebrals: Bilateral vertebral arteries demonstrate antegrade flow.  Subclavians: Normal flow hemodynamics were seen in bilateral subclavian        arteries.   01/09/2020:  TTE IMPRESSIONS  1. Normal LV function; trace AI; moderate MR.  2. Left ventricular ejection fraction, by estimation, is 60 to 65%. The  left ventricle has normal function. The left ventricle has no regional  wall motion abnormalities. Left ventricular diastolic parameters were  normal.  3. Right ventricular systolic function is normal. The right ventricular  size is normal. There is normal pulmonary artery systolic pressure.  4. The mitral valve is normal in structure. Moderate mitral valve  regurgitation. No evidence of mitral stenosis.  5. The aortic valve has an indeterminant number of cusps. Aortic valve  regurgitation is trivial. No aortic  stenosis is present.  6. The inferior vena cava is normal in size with greater than 50%  respiratory variability, suggesting right atrial pressure of 3 mmHg.   Recent Labs: 01/08/2020: BUN 7; Creatinine, Ser 0.80; Hemoglobin 14.3; Platelets 401; Potassium 3.7; Sodium 141; TSH 1.882  01/08/2020: Cholesterol 209; HDL 48; LDL Cholesterol 142; Total CHOL/HDL Ratio 4.4; Triglycerides 97; VLDL 19   Estimated Creatinine Clearance: 63 mL/min (by C-G formula based on SCr of 0.8 mg/dL).   Wt Readings from Last 3 Encounters:  01/27/20 137 lb (62.1 kg)  01/08/20 136 lb (61.7 kg)  06/04/17 151 lb (68.5 kg)     Other studies reviewed: Additional studies/records reviewed today include: summarized above  ASSESSMENT AND PLAN:  1. Dysautonomia     Seems fairly well controlled     Increased palpitations, widely fluctuating HRs     2 week Zio XT  Discussed staying adequately hydrated, may have gotten behind of this the week prior working in the shop Not to avoid salt  2. CP     EKGs and HS Trop as well as her echo are all reassuring.     Will get stress myoview done  3. VHD     Mod MR    Disposition: F/u 6 weeks, sooner if needed.  Discussed if any escalation in her symptoms to seek attention.  Current medicines are reviewed at length with  the patient today.  The patient did not have any concerns regarding medicines.  Venetia Night, PA-C 01/27/2020 11:00 AM     [place]  [id number] [phone] (office)  [phone] (fax)

## 2020-01-27 ENCOUNTER — Encounter: Payer: Self-pay | Admitting: *Deleted

## 2020-01-27 ENCOUNTER — Other Ambulatory Visit: Payer: Self-pay

## 2020-01-27 ENCOUNTER — Telehealth: Payer: Self-pay | Admitting: Radiology

## 2020-01-27 ENCOUNTER — Ambulatory Visit: Payer: Medicare HMO | Admitting: Physician Assistant

## 2020-01-27 VITALS — BP 106/72 | HR 76 | Ht 64.0 in | Wt 137.0 lb

## 2020-01-27 DIAGNOSIS — R079 Chest pain, unspecified: Secondary | ICD-10-CM | POA: Diagnosis not present

## 2020-01-27 DIAGNOSIS — Q233 Congenital mitral insufficiency: Secondary | ICD-10-CM

## 2020-01-27 DIAGNOSIS — I493 Ventricular premature depolarization: Secondary | ICD-10-CM

## 2020-01-27 DIAGNOSIS — R002 Palpitations: Secondary | ICD-10-CM

## 2020-01-27 DIAGNOSIS — G901 Familial dysautonomia [Riley-Day]: Secondary | ICD-10-CM

## 2020-01-27 MED ORDER — PROPRANOLOL HCL 40 MG PO TABS: 40.0000 mg | ORAL_TABLET | Freq: Every day | ORAL | 1 refills | Status: DC | PRN

## 2020-01-27 NOTE — Telephone Encounter (Signed)
Enrolled patient for a 14 day Zio monitor to be mailed to patiens home.

## 2020-01-27 NOTE — Patient Instructions (Signed)
Medication Instructions:  Your physician recommends that you continue on your current medications as directed. Please refer to the Current Medication list given to you today.  *If you need a refill on your cardiac medications before your next appointment, please call your pharmacy*   Lab Work: [place]   If you have labs (blood work) drawn today and your tests are completely normal, you will receive your results only by: Marland Kitchen MyChart Message (if you have MyChart) OR . A paper copy in the mail If you have any lab test that is abnormal or we need to change your treatment, we will call you to review the results.   Testing/Procedures: Your physician has requested that you have en exercise stress myoview. For further information please visit [email or web address]. Please follow instruction sheet, as given.  Your physician has recommended that you wear an event monitor. Event monitors are medical devices that record the heart's electrical activity. Doctors most often [place] these monitors to diagnose arrhythmias. Arrhythmias are problems with the speed or rhythm of the heartbeat. The monitor is a small, portable device. You can wear one while you do your normal daily activities. This is usually used to diagnose what is causing palpitations/syncope (passing out).  Follow-Up: At [facility], you and your health needs are our priority.  As part of our continuing mission to provide you with exceptional heart care, we have created designated Provider Care Teams.  These Care Teams include your primary Cardiologist (physician) and Advanced Practice Providers (APPs -  Physician Assistants and Nurse Practitioners) who all work together to provide you with the care you need, when you need it.  We recommend signing up for the patient portal called "MyChart".  Sign up information is provided on this After Visit Summary.  MyChart is used to connect with patients for Virtual Visits (Telemedicine).  Patients  are able to view lab/test results, encounter notes, upcoming appointments, etc.  Non-urgent messages can be sent to your provider as well.   To learn more about what you can do with MyChart, go to [email or web address].    Your next appointment:   6 week(s)  The format for your next appointment:   In Person  Provider:   You may see Dr. Caryl Comes or one of the following Advanced Practice Providers on your designated Care Team:    Chanetta Marshall, NP  Tommye Standard, [place]  Legrand Como "Jonni Sanger" [place], [place]    Other Instructions  [place] Monitor Instructions   Your physician has requested you wear your ZIO patch monitor__14_____days.   This is a single patch monitor.  [employer] supplies one patch monitor per enrollment.  Additional stickers are not available.   Please do not apply patch if you will be having a Nuclear Stress Test, Echocardiogram, Cardiac CT, MRI, or Chest Xray during the time frame you would be wearing the monitor. The patch cannot be worn during these tests.  You cannot remove and re-apply the ZIO XT patch monitor.   Your ZIO patch monitor will be sent USPS Priority mail from [facility] directly to your home address. The monitor may also be mailed to a PO BOX if home delivery is not available.   It may take 3-5 days to receive your monitor after you have been enrolled.   Once you have received you monitor, please review enclosed instructions.  Your monitor has already been registered assigning a specific monitor serial # to you.   Applying the monitor   Shave  hair from upper left chest.   Hold abrader disc by orange tab.  Rub abrader in 40 strokes over left upper chest as indicated in your monitor instructions.   Clean area with 4 enclosed alcohol pads .  Use all pads to assure are is cleaned thoroughly.  Let dry.   Apply patch as indicated in monitor instructions.  Patch will be place under collarbone on left side of chest with arrow pointing upward.    Rub patch adhesive wings for 2 minutes.Remove white label marked "1".  Remove white label marked "2".  Rub patch adhesive wings for 2 additional minutes.   While looking in a mirror, press and release button in center of patch.  A small green light will flash 3-4 times .  This will be your only indicator the monitor has been turned on.     Do not shower for the first 24 hours.  You may shower after the first 24 hours.   Press button if you feel a symptom. You will hear a small click.  Record Date, Time and Symptom in the Patient Log Book.   When you are ready to remove patch, follow instructions on last 2 pages of Patient Log Book.  Stick patch monitor onto last page of Patient Log Book.   Place Patient Log Book in [place] box.  Use locking tab on box and tape box closed securely.  The [employer] and [employer] has [employer] on it.  Please place in mailbox as soon as possible.  Your physician should have your test results approximately 7 days after the monitor has been mailed back to [facility].   Call [place] at [phone] if you have questions regarding your ZIO XT patch monitor.  Call them immediately if you see an orange light blinking on your monitor.   If your monitor falls off in less than 4 days contact our Monitor department at [phone].  If your monitor becomes loose or falls off after 4 days call [employer] at [phone] for suggestions on securing your monitor.

## 2020-02-01 ENCOUNTER — Telehealth (HOSPITAL_COMMUNITY): Payer: Self-pay | Admitting: *Deleted

## 2020-02-01 NOTE — Telephone Encounter (Signed)
Left message on voicemail per DPR in reference to upcoming appointment scheduled on 02/06/20 at 10:15 with detailed instructions given per Myocardial Perfusion Study Information Sheet for the test. LM to arrive 15 minutes early, and that it is imperative to arrive on time for appointment to keep from having the test rescheduled. If you need to cancel or reschedule your appointment, please call the office within 24 hours of your appointment. Failure to do so may result in a cancellation of your appointment, and a $50 no show fee. Phone number given for call back for any questions.

## 2020-02-02 ENCOUNTER — Other Ambulatory Visit (INDEPENDENT_AMBULATORY_CARE_PROVIDER_SITE_OTHER): Payer: Medicare HMO

## 2020-02-02 DIAGNOSIS — I493 Ventricular premature depolarization: Secondary | ICD-10-CM

## 2020-02-02 DIAGNOSIS — R002 Palpitations: Secondary | ICD-10-CM

## 2020-02-06 ENCOUNTER — Encounter (HOSPITAL_COMMUNITY): Payer: Medicare HMO

## 2020-02-09 ENCOUNTER — Telehealth (HOSPITAL_COMMUNITY): Payer: Self-pay | Admitting: *Deleted

## 2020-02-09 ENCOUNTER — Encounter (HOSPITAL_COMMUNITY): Payer: Self-pay | Admitting: *Deleted

## 2020-02-09 NOTE — Telephone Encounter (Signed)
Left message on voicemail per DPR in reference to upcoming appointment scheduled on  02/13/20 with detailed instructions given per Myocardial Perfusion Study Information Sheet for the test. LM to arrive 15 minutes early, and that it is imperative to arrive on time for appointment to keep from having the test rescheduled. If you need to cancel or reschedule your appointment, please call the office within 24 hours of your appointment. Failure to do so may result in a cancellation of your appointment, and a $50 no show fee. Phone number given for call back for any questions. Kirstie Peri

## 2020-02-13 ENCOUNTER — Other Ambulatory Visit: Payer: Self-pay

## 2020-02-13 ENCOUNTER — Ambulatory Visit (HOSPITAL_COMMUNITY): Payer: Medicare HMO | Attending: Cardiovascular Disease

## 2020-02-13 DIAGNOSIS — R079 Chest pain, unspecified: Secondary | ICD-10-CM | POA: Insufficient documentation

## 2020-02-13 LAB — MYOCARDIAL PERFUSION IMAGING
Estimated workload: 7 METS
Exercise duration (min): 6 min
Exercise duration (sec): 0 s
LV dias vol: 53 mL (ref 46–106)
LV sys vol: 15 mL
MPHR: 158 {beats}/min
Peak HR: 142 {beats}/min
Percent HR: 89 %
Rest HR: 64 {beats}/min
SDS: 2
SRS: 0
SSS: 2
TID: 0.96

## 2020-02-13 MED ORDER — TECHNETIUM TC 99M TETROFOSMIN IV KIT
11.0000 | PACK | Freq: Once | INTRAVENOUS | Status: AC | PRN
  Administered 2020-02-13: 11 via INTRAVENOUS
  Filled 2020-02-13: qty 11

## 2020-02-13 MED ORDER — TECHNETIUM TC 99M TETROFOSMIN IV KIT
31.2000 | PACK | Freq: Once | INTRAVENOUS | Status: AC | PRN
  Administered 2020-02-13: 31.2 via INTRAVENOUS
  Filled 2020-02-13: qty 32

## 2020-03-01 DIAGNOSIS — R002 Palpitations: Secondary | ICD-10-CM | POA: Diagnosis not present

## 2020-03-01 DIAGNOSIS — I493 Ventricular premature depolarization: Secondary | ICD-10-CM | POA: Diagnosis not present

## 2020-03-07 NOTE — Progress Notes (Signed)
This visit type was conducted due to national recommendations for restrictions regarding the COVID-19 Pandemic (e.g. social distancing) in an effort to limit this patient's exposure and mitigate transmission in our community.  Due to her co-morbid illnesses, this patient is at least at moderate risk for complications without adequate follow up.  This format is felt to be most appropriate for this patient at this time.  The patient did not have access to video technology/had technical difficulties with video requiring transitioning to audio format only (telephone).  All issues noted in this document were discussed and addressed.  No physical exam could be performed with this format.  Please refer to the patient's chart for her  consent to telehealth for [facility].   Patient Location: Home Provider Location: Office/Clinic  Cardiology Office Note Date:  03/08/2020  Patient ID:  [name], [name] 10/23/1955, MRN [id number] PCP:  Orpah Melter, MD  Cardiologist:  Dr. Caryl Comes    Chief Complaint:  planned follow up  History of Present Illness: [name] is a 64 y.o. female with history of SVT, anxiety/depression  She comes in today to be seen for Dr. Caryl Comes, last seen by him (or here at all) Nov 2018.  At that time his assessment was: Palpitations Intermittent hypotension Dysautonomia Mild MR Was doing well overall, planned to update echo in 2 years  She was hospitalized 6/20-21 with c/o CP, near syncope.  H&P noted worsening with deep breath and chest movement, no relieving factorsuntil Nitro given by EMS She was discharged with negative Trop, no recurrent symptoms, TTE noted LVEF 60-65%, no WMA, mod MR, near normal carotid US to follow up with [place].  01/08/20 LABS HS Trop <2, 3, 3 K+ 3.7 BUN/Creat 7/0.8 WBC 4.8 H/H 14/44 Plts 401 TSH 1.882   I saw her back 01/27/20 She has been quite well the last couple years, mentions post menopausal has greatly improved her dysautonomia  symptoms and will only occasionally have a weak spell.  She has not fainted in quite a long time.  About 2 weeks ago she started noticing she was having her PVCs, and her HR was widely fluctuating.  She swims regularly for exercise and does a lot of gardening and noted that even a walk around the pool her HR was shooting up to the 120's or so.  Unrelated to this he was feeling chest pressure that was a squeezing sensation seemed like something was pressing on her front to back and back to front as well, (no ripping, tearing, not radiating but squeezing).  This could occur at rest and wax/wane, while having it , did seem to feel worse when she started walking around, though exertional not a particular provoker, no other associated symptoms  No change to her exertional capacity.  No change in medicines, life events or otherwise of late.  The week prior to all of this starting she had been helping her husband inhis shop, very hot, and physical work without difficulty.  She has Inderal 40mg  PRN, she reports the same Rx for many years, has not used it for years but historically when her HR >130 she would. She was planned for stress test and 2 week monitor with follow up  Her stress test was low risk (reaching 89%MPHR) Monitor is pending offical read SR, PACs, some ST NSVT   TODAY The patient called and requested her visit be virtual via telephone 2/2 acute onset of GI symptoms with recurrent diarrhea, she thinks ate some bad  pecans. She identifies herself by name and birth date  She does not have any ongoing CP symptoms She has developed marked symptoms in the last 2 weeks of spinning sensation.  These are associated with change in head position and is a definate room spinning sensation. Not anything like her known orthostatic dizziness. No headaches, congestions, symptoms of illness,  We reviewed her monitor. The majority of her symptom triggers were associated with SR rates 80's generally.  A  couple noted with single PACs or 4beat PATs A couple triggered tracings, with fast rates, symptoms one while picking blueberries ST 110's and one while cleaning the pool, the same. One dizzy/lightheaded SR 80's One reported syncope, she states she was seated on the porch with her husband, stood up and fell to her knees, things were black but she did not have full LOC, BP 99/64 once in the house One reported dizziness, too weak to pull weeds while in the garden, SR 70's, BP 97/67 She had one NSVT 10 beats this was not associated with any symptoms   Past Medical History:  Diagnosis Date  . Complication of anesthesia    low to wake up  . Depression   . Dyspnea   . Fatigue   . GERD (gastroesophageal reflux disease)   . IBS (irritable bowel syndrome)   . Migraine headache   . Mitral valve prolapse    questional Hx  . Moderate mitral regurgitation   . Orthostatic hypotension   . Paralysis ([place])    temporary  . Right hand fracture   . Supraventricular tachycardia ([place])   . Tuberculosis of spine ([place])   . Vitamin B12 deficiency    mild  . Wears glasses     Past Surgical History:  Procedure Laterality Date  . CARDIAC CATHETERIZATION  11/25/2006   Dr. Alonza Bogus attempt  . COLONOSCOPY    . nasal septal surgery  1985  . OPEN REDUCTION INTERNAL FIXATION (ORIF) DISTAL RADIAL FRACTURE Left 11/06/2014   Procedure: OPEN REDUCTION INTERNAL FIXATION (ORIF) LEFT DISTAL RADIAL FRACTURE;  Surgeon: Milly Jakob, MD;  Location: [place];  Service: Orthopedics;  Laterality: Left;  ANESTHESIA:  GENERAL, PRE/OP BLOCK  . right hand surgery  1980   rt thumb fx  . TUBAL LIGATION     bilateral    Current Outpatient Medications  Medication Sig Dispense Refill  . aspirin 81 MG EC tablet Take 81 mg by mouth daily. Swallow whole.    Marland Kitchen FLUoxetine (PROZAC) 20 MG capsule Take 20 mg by mouth daily.    Marland Kitchen gabapentin (NEURONTIN) 800 MG tablet Take 1,600 mg by mouth at bedtime.      Marland Kitchen ibuprofen (ADVIL) 200 MG tablet Take 400-600 mg by mouth every 6 (six) hours as needed for headache (pain).    . LORazepam (ATIVAN) 0.5 MG tablet Take 0.5 mg by mouth daily as needed (muscle cramps).    . propranolol (INDERAL) 40 MG tablet Take 1 tablet (40 mg total) by mouth daily as needed (heart rate 110-120 for more than 10-20 minutes). 90 tablet 1  . zolpidem (AMBIEN) 10 MG tablet Take 10 mg by mouth at bedtime as needed for sleep.      No current facility-administered medications for this visit.    Allergies:   Erythromycin base   Social History:  The patient  reports that she has never smoked. She has never used smokeless tobacco. She reports current alcohol use. She reports that she does not use drugs.  Family History:  The patient's family history includes Atrial fibrillation in her father; Hyperlipidemia in her father; Hypertension (age of onset: 78) in her mother; Hypertension (age of onset: 72) in her father.  ROS:  Please see the history of present illness.  All other systems are reviewed and otherwise negative.   PHYSICAL EXAM: VS:  Ht 5\' 4"  (1.626 m)   Wt 132 lb (59.9 kg)   BMI 22.66 kg/m  BMI: Body mass index is 22.66 kg/m. 124/70, 75bpm 118/72, 71bpm She sounds well, no overt notable SOB, she speaks in full sentences at a normal pace. Very pleasant and animated    EKG:   Personally reviewed: 01/08/20: SR  71bpm, PAC, normal otherwise #2 SR 60bpm, low voltage limb leads 01/09/20: SR, 72bp, normal   02/13/2020: stress myoview  Nuclear stress EF: 71%.  Blood pressure demonstrated a normal response to exercise.  There was no ST segment deviation noted during stress.  The study is normal.  This is a low risk study.  The left ventricular ejection fraction is hyperdynamic (>65%).   Normal resting and stress perfusion. No ischemia or infarction EF EF 71% normal ECG response to exercise Patient achieved only 89% of PMHR   01/09/2020: Carotid  US Summary:  Right Carotid: The extracranial vessels were near-normal with only minimal  wall         thickening or plaque.   Left Carotid: The extracranial vessels were near-normal with only minimal  wall        thickening or plaque.   Vertebrals: Bilateral vertebral arteries demonstrate antegrade flow.  Subclavians: Normal flow hemodynamics were seen in bilateral subclavian        arteries.   01/09/2020: TTE IMPRESSIONS  1. Normal LV function; trace AI; moderate MR.  2. Left ventricular ejection fraction, by estimation, is 60 to 65%. The  left ventricle has normal function. The left ventricle has no regional  wall motion abnormalities. Left ventricular diastolic parameters were  normal.  3. Right ventricular systolic function is normal. The right ventricular  size is normal. There is normal pulmonary artery systolic pressure.  4. The mitral valve is normal in structure. Moderate mitral valve  regurgitation. No evidence of mitral stenosis.  5. The aortic valve has an indeterminant number of cusps. Aortic valve  regurgitation is trivial. No aortic stenosis is present.  6. The inferior vena cava is normal in size with greater than 50%  respiratory variability, suggesting right atrial pressure of 3 mmHg.   Recent Labs: 01/08/2020: BUN 7; Creatinine, Ser 0.80; Hemoglobin 14.3; Platelets 401; Potassium 3.7; Sodium 141; TSH 1.882  01/08/2020: Cholesterol 209; HDL 48; LDL Cholesterol 142; Total CHOL/HDL Ratio 4.4; Triglycerides 97; VLDL 19   CrCl cannot be calculated (Patient's most recent lab result is older than the maximum 21 days allowed.).   Wt Readings from Last 3 Encounters:  03/08/20 132 lb (59.9 kg)  02/13/20 137 lb (62.1 kg)  01/27/20 137 lb (62.1 kg)     Other studies reviewed: Additional studies/records reviewed today include: summarized above  ASSESSMENT AND PLAN:  1. Dysautonomia     Seems fairly well controlled     Increased  palpitations of late       We rediscussed staying adequately hydrated Not to avoid salt A couple episodes of orthostatic weak/black spells, no full LOC.  She reports afterwards feels very tired for about 74minutes and then settles away  2. CP     EKGs and HS Trop as well as  her echo are all reassuring.     Not an ongoing symptoms     Low risk stress test  3. VHD     Mod MR  4. Palpitations, dizziness,     Some episodes were associated with NSR others associated with PACs     She had NSVT without symptoms (normal LVEF,low risk stress test)       5. Vertigo sounding dizziness (new)     Triggered with head movements     Recommended to she reach out to her PMD   Discussed her symptoms, monitor findings and strategies. She has some low BP episodes that are symptomatic.  BP today is good. Given her NSVT and some symptomatic PACs, brief PATs will have her try taking the propanolol 20mg  (1/2 tab) daily monitoring her BP closely Discussed symptom recognition and safety    Disposition: as above .  Current medicines are reviewed at length with the patient today.  The patient did not have any concerns regarding medicines.   Time:   Today, I have spent 23 minutes with the patient with telehealth technology discussing the above problems.     Venetia Night, PA-C 03/08/2020 1:15 PM     [address] [place] [address] [phone] (office)  [phone] (fax)

## 2020-03-08 ENCOUNTER — Ambulatory Visit (INDEPENDENT_AMBULATORY_CARE_PROVIDER_SITE_OTHER): Payer: Medicare HMO | Admitting: Physician Assistant

## 2020-03-08 ENCOUNTER — Telehealth: Payer: Self-pay | Admitting: Physician Assistant

## 2020-03-08 VITALS — Ht 64.0 in | Wt 132.0 lb

## 2020-03-08 DIAGNOSIS — I491 Atrial premature depolarization: Secondary | ICD-10-CM

## 2020-03-08 DIAGNOSIS — R002 Palpitations: Secondary | ICD-10-CM

## 2020-03-08 DIAGNOSIS — I472 Ventricular tachycardia: Secondary | ICD-10-CM

## 2020-03-08 DIAGNOSIS — G901 Familial dysautonomia [Riley-Day]: Secondary | ICD-10-CM

## 2020-03-08 MED ORDER — PROPRANOLOL HCL 40 MG PO TABS: 20.0000 mg | ORAL_TABLET | Freq: Every day | ORAL | 1 refills | Status: DC

## 2020-03-08 NOTE — Patient Instructions (Signed)
Medication Instructions:   START TAKING PROPRANOLOL 20 MG ( HALF OF 40 MG )  ONCE A DAY   *If you need a refill on your cardiac medications before your next appointment, please call your pharmacy*   Lab Work: NONE ORDERED  TODAY   If you have labs (blood work) drawn today and your tests are completely normal, you will receive your results only by: Marland Kitchen MyChart Message (if you have MyChart) OR . A paper copy in the mail If you have any lab test that is abnormal or we need to change your treatment, we will call you to review the results.   Testing/Procedures: NONE ORDERED  TODAY     Follow-Up: At [facility], you and your health needs are our priority.  As part of our continuing mission to provide you with exceptional heart care, we have created designated Provider Care Teams.  These Care Teams include your primary Cardiologist (physician) and Advanced Practice Providers (APPs -  Physician Assistants and Nurse Practitioners) who all work together to provide you with the care you need, when you need it.  We recommend signing up for the patient portal called "MyChart".  Sign up information is provided on this After Visit Summary.  MyChart is used to connect with patients for Virtual Visits (Telemedicine).  Patients are able to view lab/test results, encounter notes, upcoming appointments, etc.  Non-urgent messages can be sent to your provider as well.   To learn more about what you can do with MyChart, go to [email or web address].    Your next appointment:   2 week(s)  The format for your next appointment:   Virtual Visit   Provider:    You may see  Legrand Como "Oda Kilts, PA-C    Other Instructions

## 2020-03-08 NOTE — Telephone Encounter (Signed)
Left message for patient to call back  

## 2020-03-08 NOTE — Addendum Note (Signed)
Addended by: Claude Manges on: 03/08/2020 04:45 PM   Modules accepted: Orders

## 2020-03-08 NOTE — Telephone Encounter (Signed)
Follow up  Pt is returning call from [facility]

## 2020-03-08 NOTE — Telephone Encounter (Signed)
[name] said she will call the pt.

## 2020-03-08 NOTE — Telephone Encounter (Signed)
New message:    Patient calling stating that she is sick and would like to do vv today instead of coming in the office. I called nurse no answer. Please call patient.

## 2020-03-08 NOTE — Telephone Encounter (Signed)
Patient is calling to follow up regarding request for virtual appointment.

## 2020-04-03 ENCOUNTER — Other Ambulatory Visit: Payer: Self-pay

## 2020-04-03 ENCOUNTER — Encounter: Payer: Self-pay | Admitting: Student

## 2020-04-03 ENCOUNTER — Telehealth (INDEPENDENT_AMBULATORY_CARE_PROVIDER_SITE_OTHER): Payer: Medicare HMO | Admitting: Student

## 2020-04-03 VITALS — BP 95/70 | HR 70 | Ht 64.0 in | Wt 132.0 lb

## 2020-04-03 DIAGNOSIS — R002 Palpitations: Secondary | ICD-10-CM

## 2020-04-03 DIAGNOSIS — I493 Ventricular premature depolarization: Secondary | ICD-10-CM | POA: Diagnosis not present

## 2020-04-03 DIAGNOSIS — I472 Ventricular tachycardia: Secondary | ICD-10-CM

## 2020-04-03 DIAGNOSIS — G901 Familial dysautonomia [Riley-Day]: Secondary | ICD-10-CM | POA: Diagnosis not present

## 2020-04-03 DIAGNOSIS — I491 Atrial premature depolarization: Secondary | ICD-10-CM

## 2020-04-03 DIAGNOSIS — I4729 Other ventricular tachycardia: Secondary | ICD-10-CM

## 2020-04-03 NOTE — Patient Instructions (Signed)
Medication Instructions:  *If you need a refill on your cardiac medications before your next appointment, please call your pharmacy*  Follow-Up: At [facility], you and your health needs are our priority.  As part of our continuing mission to provide you with exceptional heart care, we have created designated Provider Care Teams.  These Care Teams include your primary Cardiologist (physician) and Advanced Practice Providers (APPs -  Physician Assistants and Nurse Practitioners) who all work together to provide you with the care you need, when you need it.  We recommend signing up for the patient portal called "MyChart".  Sign up information is provided on this After Visit Summary.  MyChart is used to connect with patients for Virtual Visits (Telemedicine).  Patients are able to view lab/test results, encounter notes, upcoming appointments, etc.  Non-urgent messages can be sent to your provider as well.   To learn more about what you can do with MyChart, go to [email or web address].    Your next appointment:   Your physician recommends that you schedule a follow-up appointment in March 2022 with Dr. Caryl Comes  The format for your next appointment:   In Person with Virl Axe, MD

## 2020-04-03 NOTE — Progress Notes (Signed)
This visit type was conducted due to national recommendations for restrictions regarding the COVID-19 Pandemic (e.g. social distancing) in an effort to limit this patient's exposure and mitigate transmission in our community.  Due to her co-morbid illnesses, this patient is at least at moderate risk for complications without adequate follow up.  This format is felt to be most appropriate for this patient at this time.  The patient did not have access to video technology/had technical difficulties with video requiring transitioning to audio format only (telephone).  All issues noted in this document were discussed and addressed.  No physical exam could be performed with this format.  Please refer to the patient's chart for her  consent to telehealth for [facility].   Patient Location: Home Provider Location: Office/Clinic  Cardiology Office Note Date:  04/03/2020  Patient ID:  [name], [name] August 22, 1955, MRN [id number] PCP:  Orpah Melter, MD  Cardiologist:  Dr. Caryl Comes    Chief Complaint:  Planned follow up today.   History of Present Illness: [name] is a 64 y.o. female with history of SVT, anxiety/depression  She presents today be seen for Dr. Caryl Comes, last seen by him Nov 2018.  At that time his assessment was: Palpitations Intermittent hypotension Dysautonomia Mild MR Was doing well overall, planned to update echo in 2 years  She was hospitalized 6/20-21 with c/o CP, near syncope.  H&P noted worsening with deep breath and chest movement, no relieving factorsuntil Nitro given by EMS She was discharged with negative Trop, no recurrent symptoms, TTE noted LVEF 60-65%, no WMA, mod MR, near normal carotid US to follow up with [place].  01/08/20 LABS HS Trop <2, 3, 3 K+ 3.7 BUN/Creat 7/0.8 WBC 4.8 H/H 14/44 Plts 401 TSH 1.882   Seen by Tommye Standard, PA-C 01/27/20 She has been quite well the last couple years, mentions post menopausal has greatly improved her dysautonomia  symptoms and will only occasionally have a weak spell.  She has not fainted in quite a long time.  About 2 weeks ago she started noticing she was having her PVCs, and her HR was widely fluctuating.  She swims regularly for exercise and does a lot of gardening and noted that even a walk around the pool her HR was shooting up to the 120's or so.  Unrelated to this he was feeling chest pressure that was a squeezing sensation seemed like something was pressing on her front to back and back to front as well, (no ripping, tearing, not radiating but squeezing).  This could occur at rest and wax/wane, while having it , did seem to feel worse when she started walking around, though exertional not a particular provoker, no other associated symptoms  No change to her exertional capacity.  No change in medicines, life events or otherwise of late.  The week prior to all of this starting she had been helping her husband inhis shop, very hot, and physical work without difficulty.  She has Inderal 40mg  PRN, she reports the same Rx for many years, has not used it for years but historically when her HR >130 she would. She was planned for stress test and 2 week monitor with follow up  Her stress test was low risk (reaching 89%MPHR) Monitor is pending offical read SR, PACs, some ST NSVT   Seen by Tommye Standard, PA-C 03/08/2020 The patient called and requested her visit be virtual via telephone 2/2 acute onset of GI symptoms with recurrent diarrhea, she thinks ate  some bad pecans. She identifies herself by name and birth date  She does not have any ongoing CP symptoms She has developed marked symptoms in the last 2 weeks of spinning sensation.  These are associated with change in head position and is a definate room spinning sensation. Not anything like her known orthostatic dizziness. No headaches, congestions, symptoms of illness,  We reviewed her monitor. The majority of her symptom triggers were associated  with SR rates 80's generally.  A couple noted with single PACs or 4beat PATs A couple triggered tracings, with fast rates, symptoms one while picking blueberries ST 110's and one while cleaning the pool, the same. One dizzy/lightheaded SR 80's One reported syncope, she states she was seated on the porch with her husband, stood up and fell to her knees, things were black but she did not have full LOC, BP 99/64 once in the house One reported dizziness, too weak to pull weeds while in the garden, SR 70's, BP 97/67 She had one NSVT 10 beats this was not associated with any symptoms  She presents today for telephone visit for follow up  She states she is doing "much much" better on the half of inderal. She states her "vertigo" is nearly gone and her palpitations bother her far less. Blood pressure remains up and down. Occasional in the 80s, but more usually in the 90s/low 100s. When she gets low she drinks a little bit of caffeine and this helps. She does not LIMIT or salt, but she doesn't use it liberally either.     Past Medical History:  Diagnosis Date  . Complication of anesthesia    low to wake up  . Depression   . Dyspnea   . Fatigue   . GERD (gastroesophageal reflux disease)   . IBS (irritable bowel syndrome)   . Migraine headache   . Mitral valve prolapse    questional Hx  . Moderate mitral regurgitation   . Orthostatic hypotension   . Paralysis ([place])    temporary  . Right hand fracture   . Supraventricular tachycardia ([place])   . Tuberculosis of spine ([place])   . Vitamin B12 deficiency    mild  . Wears glasses     Past Surgical History:  Procedure Laterality Date  . CARDIAC CATHETERIZATION  11/25/2006   Dr. Alonza Bogus attempt  . COLONOSCOPY    . nasal septal surgery  1985  . OPEN REDUCTION INTERNAL FIXATION (ORIF) DISTAL RADIAL FRACTURE Left 11/06/2014   Procedure: OPEN REDUCTION INTERNAL FIXATION (ORIF) LEFT DISTAL RADIAL FRACTURE;  Surgeon: Milly Jakob, MD;   Location: [place];  Service: Orthopedics;  Laterality: Left;  ANESTHESIA:  GENERAL, PRE/OP BLOCK  . right hand surgery  1980   rt thumb fx  . TUBAL LIGATION     bilateral    Current Outpatient Medications  Medication Sig Dispense Refill  . FLUoxetine (PROZAC) 20 MG capsule Take 20 mg by mouth daily.    Marland Kitchen gabapentin (NEURONTIN) 800 MG tablet Take 1,600 mg by mouth at bedtime.     Marland Kitchen ibuprofen (ADVIL) 200 MG tablet Take 400-600 mg by mouth every 6 (six) hours as needed for headache (pain).    . LORazepam (ATIVAN) 0.5 MG tablet Take 0.5 mg by mouth daily as needed (muscle cramps).    . propranolol (INDERAL) 40 MG tablet Take 0.5 tablets (20 mg total) by mouth daily. 90 tablet 1  . zolpidem (AMBIEN) 10 MG tablet Take 10 mg by mouth at bedtime  as needed for sleep.      No current facility-administered medications for this visit.    Allergies:   Erythromycin base   Social History:  The patient  reports that she has never smoked. She has never used smokeless tobacco. She reports current alcohol use. She reports that she does not use drugs.   Family History:  The patient's family history includes Atrial fibrillation in her father; Hyperlipidemia in her father; Hypertension (age of onset: 11) in her mother; Hypertension (age of onset: 63) in her father.  ROS:  Review of systems complete and found to be negative unless listed in HPI.    PHYSICAL EXAM: VS:  BP 95/70   Pulse 70   Ht 5\' 4"  (1.626 m)   Wt 132 lb (59.9 kg)   BMI 22.66 kg/m  BMI: Body mass index is 22.66 kg/m. Pt sounds well over the phone. She speaks in full sentences with no increased work of breathing.  Very pleasant and responds appropriately.   EKG:   Personally reviewed: 01/08/20: SR  71bpm, PAC, normal otherwise #2 SR 60bpm, low voltage limb leads 01/09/20: SR, 72bp, normal   02/13/2020: stress myoview  Nuclear stress EF: 71%.  Blood pressure demonstrated a normal response to exercise.  There  was no ST segment deviation noted during stress.  The study is normal.  This is a low risk study.  The left ventricular ejection fraction is hyperdynamic (>65%).   Normal resting and stress perfusion. No ischemia or infarction EF EF 71% normal ECG response to exercise Patient achieved only 89% of PMHR   01/09/2020: Carotid US Summary:  Right Carotid: The extracranial vessels were near-normal with only minimal  wall         thickening or plaque.   Left Carotid: The extracranial vessels were near-normal with only minimal  wall        thickening or plaque.   Vertebrals: Bilateral vertebral arteries demonstrate antegrade flow.  Subclavians: Normal flow hemodynamics were seen in bilateral subclavian        arteries.   01/09/2020: TTE IMPRESSIONS  1. Normal LV function; trace AI; moderate MR.  2. Left ventricular ejection fraction, by estimation, is 60 to 65%. The  left ventricle has normal function. The left ventricle has no regional  wall motion abnormalities. Left ventricular diastolic parameters were  normal.  3. Right ventricular systolic function is normal. The right ventricular  size is normal. There is normal pulmonary artery systolic pressure.  4. The mitral valve is normal in structure. Moderate mitral valve  regurgitation. No evidence of mitral stenosis.  5. The aortic valve has an indeterminant number of cusps. Aortic valve  regurgitation is trivial. No aortic stenosis is present.  6. The inferior vena cava is normal in size with greater than 50%  respiratory variability, suggesting right atrial pressure of 3 mmHg.   Recent Labs: 01/08/2020: BUN 7; Creatinine, Ser 0.80; Hemoglobin 14.3; Platelets 401; Potassium 3.7; Sodium 141; TSH 1.882  01/08/2020: Cholesterol 209; HDL 48; LDL Cholesterol 142; Total CHOL/HDL Ratio 4.4; Triglycerides 97; VLDL 19   CrCl cannot be calculated (Patient's most recent lab result is older than the maximum 21  days allowed.).   Wt Readings from Last 3 Encounters:  04/03/20 132 lb (59.9 kg)  03/08/20 132 lb (59.9 kg)  02/13/20 137 lb (62.1 kg)     Other studies reviewed: Additional studies/records reviewed today include: Summarized above.   ASSESSMENT AND PLAN:  1. Dysautonomia Overall well controlled.  Discussed  lifestyle modifications.  Reinforced hydration Free to use salt.   Her episodes of orthostatic weak/black spells have improved.  She reports afterwards feels very tired for about 81minutes and then settles away  2. CP EKGs and HS Trop as well as her echo are all reassuring. Low risk stress test.  No further work up warranted at this time.   3. VHD Mod MR  4. Palpitations, dizziness Monitor as above.  Some of her symptoms associated with NSR others associated with PACs She had NSVT without symptoms (normal LVEF,low risk stress test)  5. Vertigo sounding dizziness  Described as being triggered with head movements.  Per PCP.   She is doing very well overall. She would like to see Dr. Caryl Comes again in the 6-12 month range. Previously following as seldomly as q 2 years, but with recent symptoms would like to move up at least for the next visit. Pt knows to call with any further issues.   Disposition: As above.  Current medicines are reviewed at length with the patient today.  The patient did not have any concerns regarding medicines.  Time:   Today, I have spent 12 minutes with the patient with telehealth technology discussing the above problems.    Signed, Lollie Marrow, PA-C  04/03/2020 10:11 AM    [facility] [address] [place] [address] [phone] (office)  [phone] (fax)

## 2020-10-08 ENCOUNTER — Ambulatory Visit: Payer: Medicare HMO | Admitting: Internal Medicine

## 2020-10-08 DIAGNOSIS — G901 Familial dysautonomia [Riley-Day]: Secondary | ICD-10-CM

## 2020-10-08 DIAGNOSIS — I472 Ventricular tachycardia: Secondary | ICD-10-CM

## 2020-10-08 DIAGNOSIS — R002 Palpitations: Secondary | ICD-10-CM

## 2020-10-08 NOTE — Progress Notes (Incomplete)
Patient Care Team: Orpah Melter, MD as PCP - General (Family Medicine) Deboraha Sprang, MD as Consulting Physician (Cardiology)   HPI  [name] is a 65 y.o. female seen in followup for recurrent spells of lightheadedness and dizziness. She has seen multiple physicians over the years for this with varying diagnoses. Most recently she was at [facility] for evaluation of "spells". Neurology service probably thought that these were"all in her head".    She continues to do well   She struggles with dizziness but is exercising.  She is fluid and salt replete  Her daughter miscarried the other day    Past Medical History:  Diagnosis Date  . Complication of anesthesia    low to wake up  . Depression   . Dyspnea   . Fatigue   . GERD (gastroesophageal reflux disease)   . IBS (irritable bowel syndrome)   . Migraine headache   . Mitral valve prolapse    questional Hx  . Moderate mitral regurgitation   . Orthostatic hypotension   . Paralysis ([place])    temporary  . Right hand fracture   . Supraventricular tachycardia ([place])   . Tuberculosis of spine ([place])   . Vitamin B12 deficiency    mild  . Wears glasses     Past Surgical History:  Procedure Laterality Date  . CARDIAC CATHETERIZATION  11/25/2006   Dr. Alonza Bogus attempt  . COLONOSCOPY    . nasal septal surgery  1985  . OPEN REDUCTION INTERNAL FIXATION (ORIF) DISTAL RADIAL FRACTURE Left 11/06/2014   Procedure: OPEN REDUCTION INTERNAL FIXATION (ORIF) LEFT DISTAL RADIAL FRACTURE;  Surgeon: Milly Jakob, MD;  Location: [place];  Service: Orthopedics;  Laterality: Left;  ANESTHESIA:  GENERAL, PRE/OP BLOCK  . right hand surgery  1980   rt thumb fx  . TUBAL LIGATION     bilateral    Current Outpatient Medications  Medication Sig Dispense Refill  . FLUoxetine (PROZAC) 20 MG capsule Take 20 mg by mouth daily.    Marland Kitchen gabapentin (NEURONTIN) 800 MG tablet Take 1,600 mg by mouth at  bedtime.     Marland Kitchen ibuprofen (ADVIL) 200 MG tablet Take 400-600 mg by mouth every 6 (six) hours as needed for headache (pain).    . LORazepam (ATIVAN) 0.5 MG tablet Take 0.5 mg by mouth daily as needed (muscle cramps).    . propranolol (INDERAL) 40 MG tablet Take 0.5 tablets (20 mg total) by mouth daily. 90 tablet 1  . zolpidem (AMBIEN) 10 MG tablet Take 10 mg by mouth at bedtime as needed for sleep.      No current facility-administered medications for this visit.    Allergies  Allergen Reactions  . Erythromycin Base Nausea And Vomiting      Review of Systems negative except from HPI and PMH  Physical Exam There were no vitals taken for this visit. Well developed and nourished in no acute distress HENT normal Neck supple with JVP-  flat *** Clear Regular rate and rhythm, no murmurs or gallops Abd-soft with active BS No Clubbing cyanosis edema Skin-warm and dry A & Oriented  Grossly normal sensory and motor function  ECG ***   Assessment and  Plan  Palpitations  Intermittent hypotension  Dysautonomia  Mitral regurgitation-mild   Overall doing very well  Encourage sodium intake and increased exercise  Will repeat echo in 2 yrs  We spent more than 50% of our >25 min visit in face to  face counseling regarding the above

## 2020-10-23 IMAGING — CR DG CHEST 2V
2 series · 2 of 2 positions shown · non-contrast
Comparison: May 10, 2017

CLINICAL DATA: Chest pain.

EXAM:
CHEST - 2 VIEW

[chest pa]
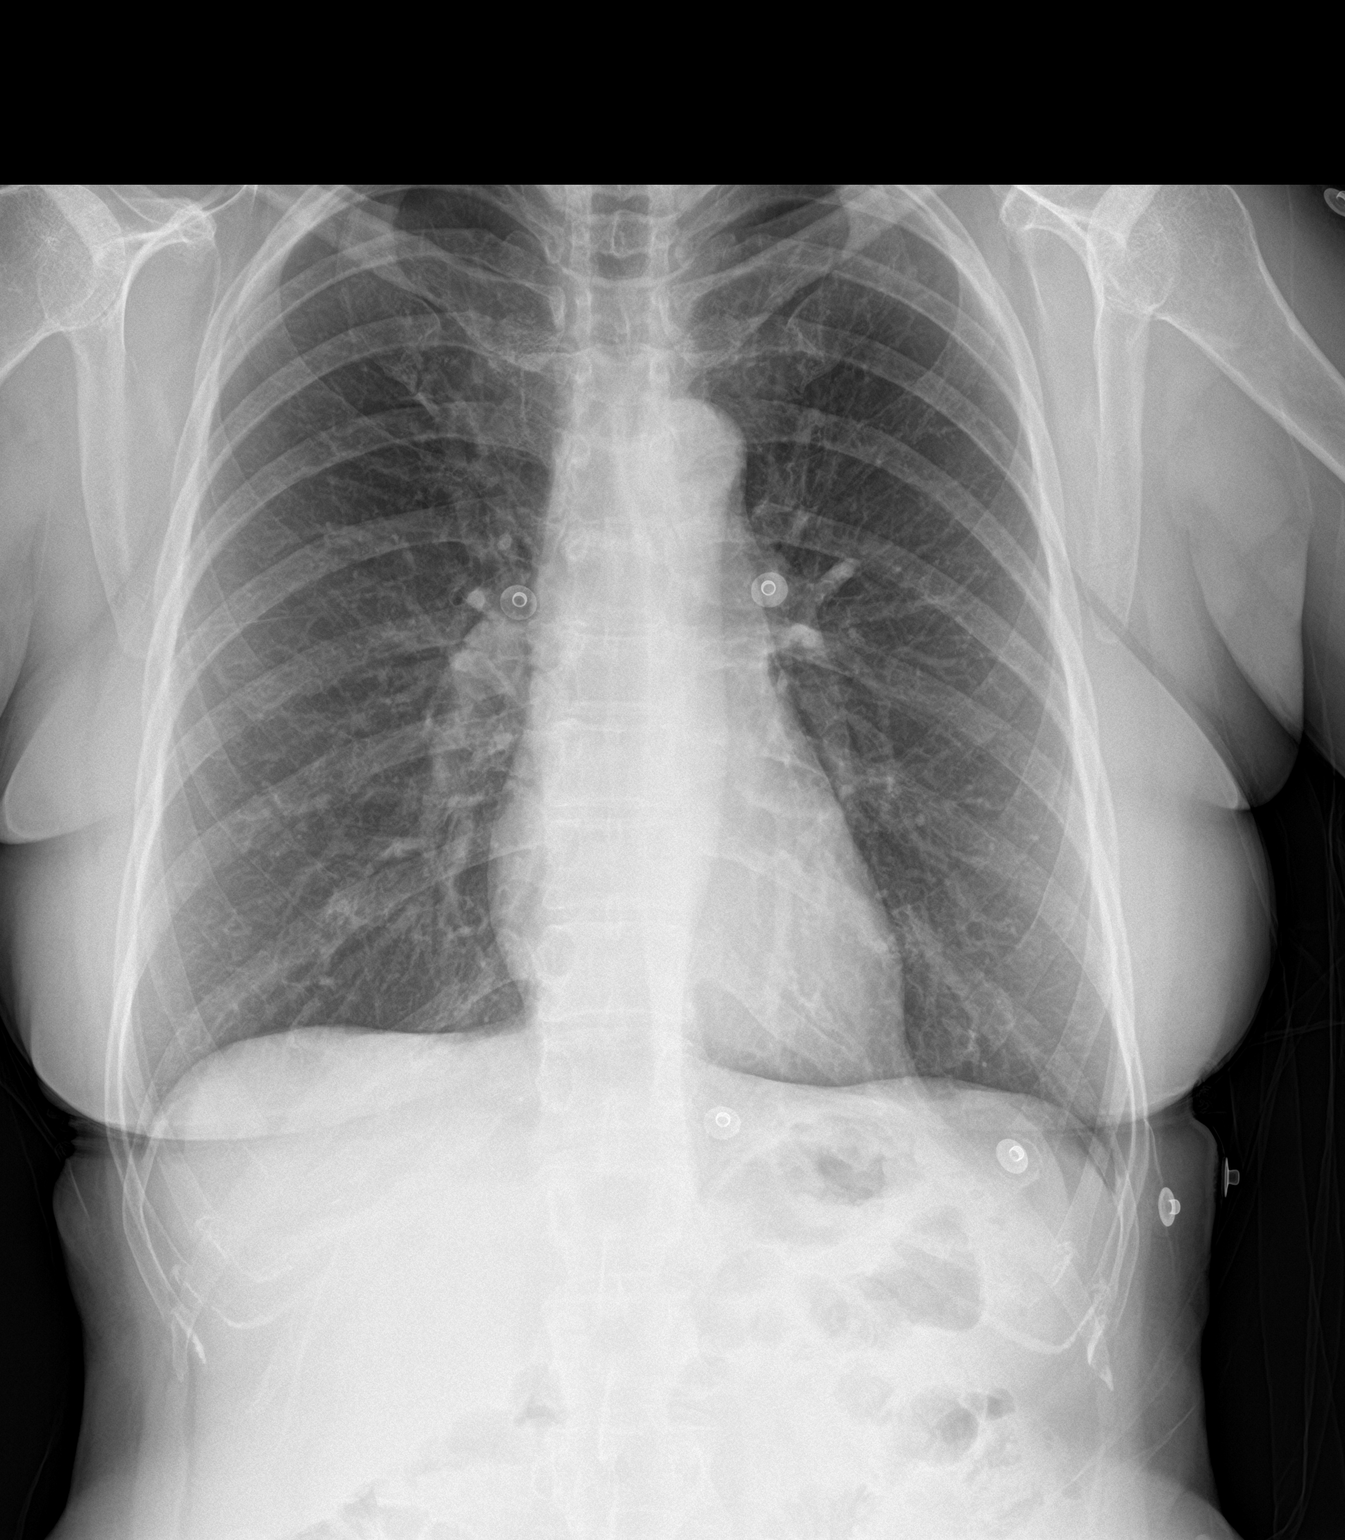

[chest lat]
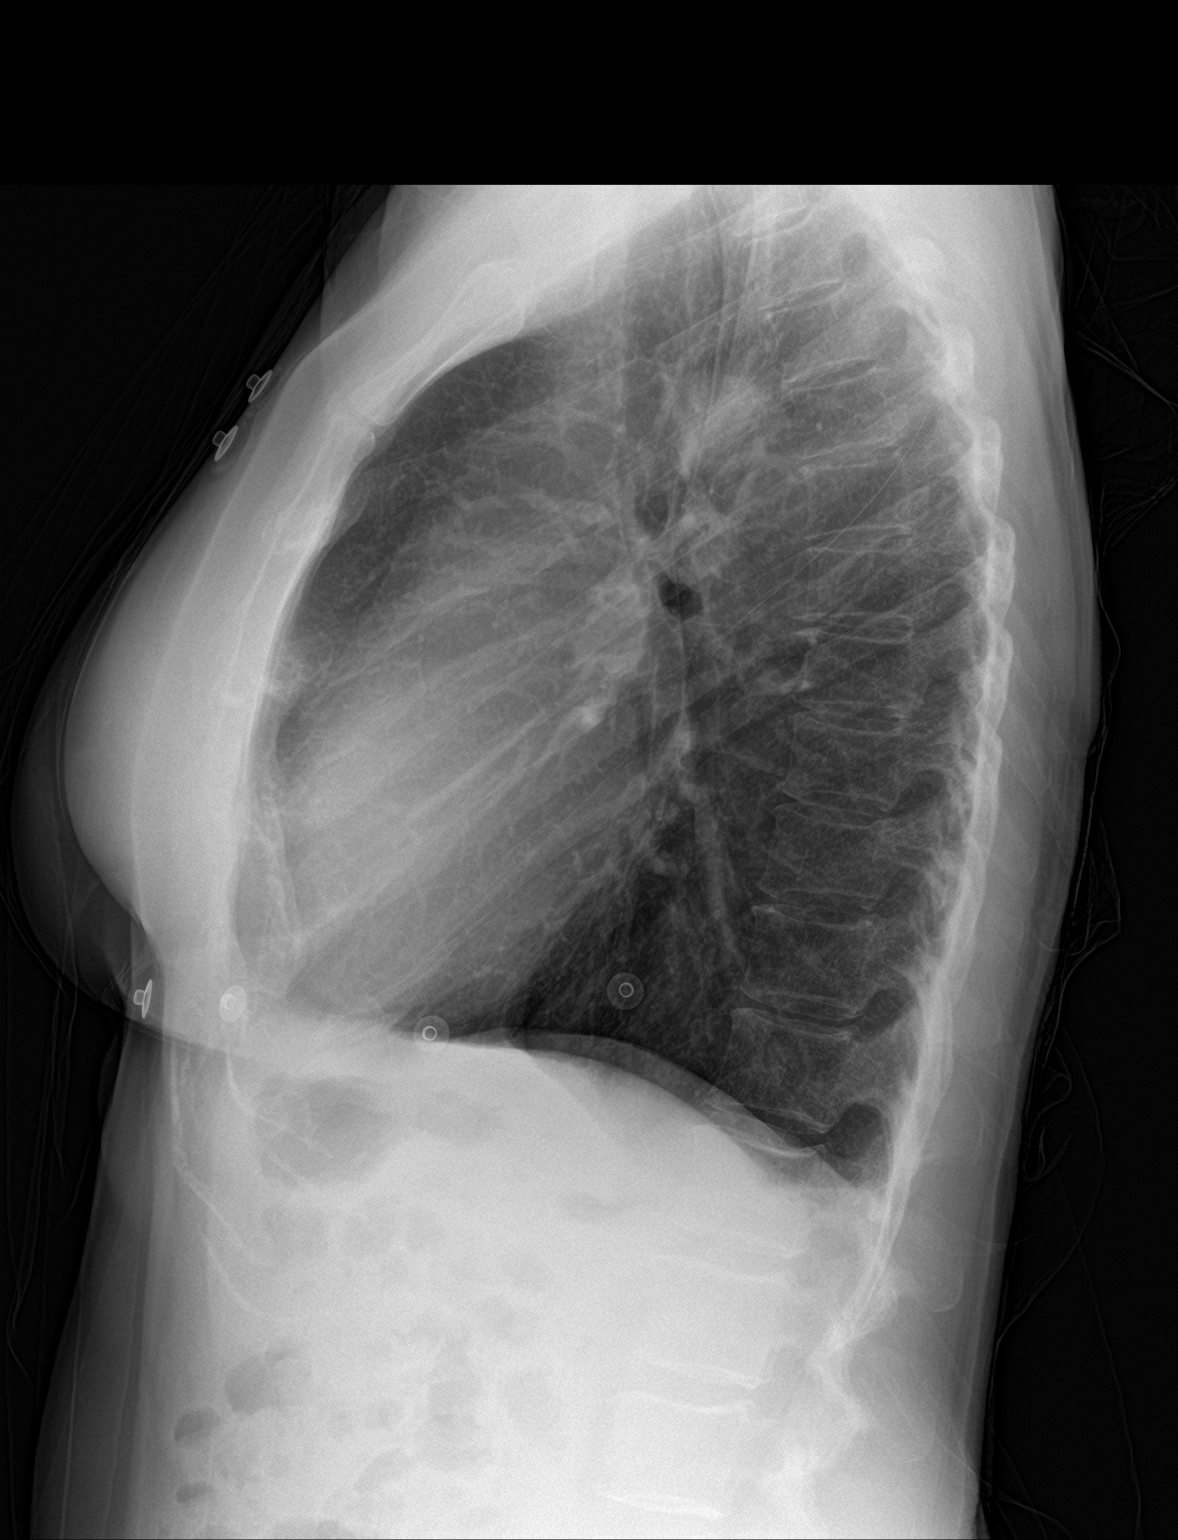

[2 of 2 positions shown; findings below may reference images not displayed]

FINDINGS: The heart, hila, and mediastinum are normal. No pneumothorax. No
nodules or masses. No focal infiltrates. No acute abnormalities are
identified.
IMPRESSION: No active cardiopulmonary disease.

## 2020-12-31 DIAGNOSIS — M791 Myalgia, unspecified site: Secondary | ICD-10-CM | POA: Diagnosis not present

## 2020-12-31 DIAGNOSIS — Z1211 Encounter for screening for malignant neoplasm of colon: Secondary | ICD-10-CM | POA: Diagnosis not present

## 2020-12-31 DIAGNOSIS — G4700 Insomnia, unspecified: Secondary | ICD-10-CM | POA: Diagnosis not present

## 2021-03-29 DIAGNOSIS — U071 COVID-19: Secondary | ICD-10-CM | POA: Diagnosis not present

## 2021-04-18 ENCOUNTER — Other Ambulatory Visit: Payer: Self-pay | Admitting: Physician Assistant

## 2021-06-26 DIAGNOSIS — H21233 Degeneration of iris (pigmentary), bilateral: Secondary | ICD-10-CM | POA: Diagnosis not present

## 2021-06-26 DIAGNOSIS — H04123 Dry eye syndrome of bilateral lacrimal glands: Secondary | ICD-10-CM | POA: Diagnosis not present

## 2021-06-26 DIAGNOSIS — H2513 Age-related nuclear cataract, bilateral: Secondary | ICD-10-CM | POA: Diagnosis not present

## 2021-06-26 DIAGNOSIS — H40013 Open angle with borderline findings, low risk, bilateral: Secondary | ICD-10-CM | POA: Diagnosis not present

## 2021-06-26 DIAGNOSIS — H524 Presbyopia: Secondary | ICD-10-CM | POA: Diagnosis not present

## 2021-08-06 DIAGNOSIS — G4700 Insomnia, unspecified: Secondary | ICD-10-CM | POA: Diagnosis not present

## 2021-08-06 DIAGNOSIS — M791 Myalgia, unspecified site: Secondary | ICD-10-CM | POA: Diagnosis not present

## 2021-08-06 DIAGNOSIS — F419 Anxiety disorder, unspecified: Secondary | ICD-10-CM | POA: Diagnosis not present

## 2021-08-23 DIAGNOSIS — J0110 Acute frontal sinusitis, unspecified: Secondary | ICD-10-CM | POA: Diagnosis not present

## 2021-09-17 DIAGNOSIS — H2513 Age-related nuclear cataract, bilateral: Secondary | ICD-10-CM | POA: Diagnosis not present

## 2021-09-17 DIAGNOSIS — H2511 Age-related nuclear cataract, right eye: Secondary | ICD-10-CM | POA: Diagnosis not present

## 2021-09-17 DIAGNOSIS — H21233 Degeneration of iris (pigmentary), bilateral: Secondary | ICD-10-CM | POA: Diagnosis not present

## 2021-09-17 DIAGNOSIS — H40013 Open angle with borderline findings, low risk, bilateral: Secondary | ICD-10-CM | POA: Diagnosis not present

## 2021-09-17 DIAGNOSIS — H04123 Dry eye syndrome of bilateral lacrimal glands: Secondary | ICD-10-CM | POA: Diagnosis not present

## 2021-10-21 DIAGNOSIS — R051 Acute cough: Secondary | ICD-10-CM | POA: Diagnosis not present

## 2021-10-22 DIAGNOSIS — Z20822 Contact with and (suspected) exposure to covid-19: Secondary | ICD-10-CM | POA: Diagnosis not present

## 2021-10-22 DIAGNOSIS — R051 Acute cough: Secondary | ICD-10-CM | POA: Diagnosis not present

## 2021-11-06 DIAGNOSIS — H268 Other specified cataract: Secondary | ICD-10-CM | POA: Diagnosis not present

## 2021-11-06 DIAGNOSIS — H2511 Age-related nuclear cataract, right eye: Secondary | ICD-10-CM | POA: Diagnosis not present

## 2021-11-12 DIAGNOSIS — H2512 Age-related nuclear cataract, left eye: Secondary | ICD-10-CM | POA: Diagnosis not present

## 2021-11-20 DIAGNOSIS — H268 Other specified cataract: Secondary | ICD-10-CM | POA: Diagnosis not present

## 2021-11-20 DIAGNOSIS — H2512 Age-related nuclear cataract, left eye: Secondary | ICD-10-CM | POA: Diagnosis not present

## 2022-01-02 DIAGNOSIS — F411 Generalized anxiety disorder: Secondary | ICD-10-CM | POA: Diagnosis not present

## 2022-01-27 DIAGNOSIS — M5442 Lumbago with sciatica, left side: Secondary | ICD-10-CM | POA: Diagnosis not present

## 2022-02-26 DIAGNOSIS — M5442 Lumbago with sciatica, left side: Secondary | ICD-10-CM | POA: Diagnosis not present

## 2022-02-28 DIAGNOSIS — M5442 Lumbago with sciatica, left side: Secondary | ICD-10-CM | POA: Diagnosis not present

## 2022-03-03 DIAGNOSIS — M5442 Lumbago with sciatica, left side: Secondary | ICD-10-CM | POA: Diagnosis not present

## 2022-03-17 DIAGNOSIS — M5442 Lumbago with sciatica, left side: Secondary | ICD-10-CM | POA: Diagnosis not present

## 2022-03-21 DIAGNOSIS — M5442 Lumbago with sciatica, left side: Secondary | ICD-10-CM | POA: Diagnosis not present

## 2022-03-28 DIAGNOSIS — M5442 Lumbago with sciatica, left side: Secondary | ICD-10-CM | POA: Diagnosis not present

## 2022-03-31 DIAGNOSIS — M5442 Lumbago with sciatica, left side: Secondary | ICD-10-CM | POA: Diagnosis not present

## 2022-04-03 DIAGNOSIS — M5442 Lumbago with sciatica, left side: Secondary | ICD-10-CM | POA: Diagnosis not present

## 2022-04-07 DIAGNOSIS — M5442 Lumbago with sciatica, left side: Secondary | ICD-10-CM | POA: Diagnosis not present

## 2022-04-11 DIAGNOSIS — M5442 Lumbago with sciatica, left side: Secondary | ICD-10-CM | POA: Diagnosis not present

## 2022-04-14 DIAGNOSIS — M5442 Lumbago with sciatica, left side: Secondary | ICD-10-CM | POA: Diagnosis not present

## 2022-04-16 DIAGNOSIS — M5442 Lumbago with sciatica, left side: Secondary | ICD-10-CM | POA: Diagnosis not present

## 2022-04-28 DIAGNOSIS — M5442 Lumbago with sciatica, left side: Secondary | ICD-10-CM | POA: Diagnosis not present

## 2022-05-01 DIAGNOSIS — M5442 Lumbago with sciatica, left side: Secondary | ICD-10-CM | POA: Diagnosis not present

## 2022-05-05 DIAGNOSIS — M5442 Lumbago with sciatica, left side: Secondary | ICD-10-CM | POA: Diagnosis not present

## 2022-05-12 DIAGNOSIS — M5442 Lumbago with sciatica, left side: Secondary | ICD-10-CM | POA: Diagnosis not present

## 2022-05-30 DIAGNOSIS — J019 Acute sinusitis, unspecified: Secondary | ICD-10-CM | POA: Diagnosis not present

## 2022-06-30 DIAGNOSIS — H04123 Dry eye syndrome of bilateral lacrimal glands: Secondary | ICD-10-CM | POA: Diagnosis not present

## 2022-06-30 DIAGNOSIS — H21233 Degeneration of iris (pigmentary), bilateral: Secondary | ICD-10-CM | POA: Diagnosis not present

## 2022-06-30 DIAGNOSIS — H40013 Open angle with borderline findings, low risk, bilateral: Secondary | ICD-10-CM | POA: Diagnosis not present

## 2022-06-30 DIAGNOSIS — Z961 Presence of intraocular lens: Secondary | ICD-10-CM | POA: Diagnosis not present

## 2022-07-17 DIAGNOSIS — Z131 Encounter for screening for diabetes mellitus: Secondary | ICD-10-CM | POA: Diagnosis not present

## 2022-07-17 DIAGNOSIS — Z0000 Encounter for general adult medical examination without abnormal findings: Secondary | ICD-10-CM | POA: Diagnosis not present

## 2022-07-17 DIAGNOSIS — G4700 Insomnia, unspecified: Secondary | ICD-10-CM | POA: Diagnosis not present

## 2022-07-17 DIAGNOSIS — M5442 Lumbago with sciatica, left side: Secondary | ICD-10-CM | POA: Diagnosis not present

## 2022-07-17 DIAGNOSIS — Z1239 Encounter for other screening for malignant neoplasm of breast: Secondary | ICD-10-CM | POA: Diagnosis not present

## 2022-07-17 DIAGNOSIS — Z124 Encounter for screening for malignant neoplasm of cervix: Secondary | ICD-10-CM | POA: Diagnosis not present

## 2022-07-17 DIAGNOSIS — E785 Hyperlipidemia, unspecified: Secondary | ICD-10-CM | POA: Diagnosis not present

## 2022-07-17 DIAGNOSIS — F411 Generalized anxiety disorder: Secondary | ICD-10-CM | POA: Diagnosis not present

## 2022-07-17 DIAGNOSIS — Z1211 Encounter for screening for malignant neoplasm of colon: Secondary | ICD-10-CM | POA: Diagnosis not present

## 2022-07-18 ENCOUNTER — Other Ambulatory Visit (HOSPITAL_BASED_OUTPATIENT_CLINIC_OR_DEPARTMENT_OTHER): Payer: Self-pay | Admitting: Family Medicine

## 2022-07-18 DIAGNOSIS — Z1231 Encounter for screening mammogram for malignant neoplasm of breast: Secondary | ICD-10-CM

## 2022-07-23 DIAGNOSIS — H26491 Other secondary cataract, right eye: Secondary | ICD-10-CM | POA: Diagnosis not present

## 2022-07-28 ENCOUNTER — Ambulatory Visit (HOSPITAL_BASED_OUTPATIENT_CLINIC_OR_DEPARTMENT_OTHER)
Admission: RE | Admit: 2022-07-28 | Discharge: 2022-07-28 | Disposition: A | Discharge status: Home / Self Care | Payer: Medicare HMO | Source: Ambulatory Visit | Attending: Family Medicine | Admitting: Family Medicine

## 2022-07-28 DIAGNOSIS — Z1231 Encounter for screening mammogram for malignant neoplasm of breast: Secondary | ICD-10-CM | POA: Diagnosis not present

## 2022-07-29 ENCOUNTER — Other Ambulatory Visit: Payer: Self-pay | Admitting: Family Medicine

## 2022-07-29 DIAGNOSIS — R928 Other abnormal and inconclusive findings on diagnostic imaging of breast: Secondary | ICD-10-CM

## 2022-08-06 ENCOUNTER — Ambulatory Visit
Admission: RE | Admit: 2022-08-06 | Discharge: 2022-08-06 | Disposition: A | Discharge status: Home / Self Care | Payer: Medicare HMO | Source: Ambulatory Visit | Attending: Family Medicine | Admitting: Family Medicine

## 2022-08-06 ENCOUNTER — Other Ambulatory Visit: Payer: Self-pay | Admitting: Family Medicine

## 2022-08-06 DIAGNOSIS — R928 Other abnormal and inconclusive findings on diagnostic imaging of breast: Secondary | ICD-10-CM

## 2022-08-06 DIAGNOSIS — N6321 Unspecified lump in the left breast, upper outer quadrant: Secondary | ICD-10-CM | POA: Diagnosis not present

## 2022-08-06 DIAGNOSIS — N6320 Unspecified lump in the left breast, unspecified quadrant: Secondary | ICD-10-CM

## 2022-08-06 DIAGNOSIS — N6323 Unspecified lump in the left breast, lower outer quadrant: Secondary | ICD-10-CM | POA: Diagnosis not present

## 2022-08-12 ENCOUNTER — Other Ambulatory Visit: Payer: Medicare HMO

## 2022-08-25 ENCOUNTER — Ambulatory Visit
Admission: RE | Admit: 2022-08-25 | Discharge: 2022-08-25 | Disposition: A | Discharge status: Home / Self Care | Payer: Medicare HMO | Source: Ambulatory Visit | Attending: Family Medicine | Admitting: Family Medicine

## 2022-08-25 DIAGNOSIS — D242 Benign neoplasm of left breast: Secondary | ICD-10-CM | POA: Diagnosis not present

## 2022-08-25 DIAGNOSIS — N6320 Unspecified lump in the left breast, unspecified quadrant: Secondary | ICD-10-CM

## 2022-08-25 DIAGNOSIS — N6325 Unspecified lump in the left breast, overlapping quadrants: Secondary | ICD-10-CM | POA: Diagnosis not present

## 2022-08-25 HISTORY — PX: BREAST BIOPSY: SHX20

## 2022-09-01 DIAGNOSIS — H26492 Other secondary cataract, left eye: Secondary | ICD-10-CM | POA: Diagnosis not present

## 2022-12-29 DIAGNOSIS — D225 Melanocytic nevi of trunk: Secondary | ICD-10-CM | POA: Diagnosis not present

## 2022-12-29 DIAGNOSIS — L821 Other seborrheic keratosis: Secondary | ICD-10-CM | POA: Diagnosis not present

## 2022-12-29 DIAGNOSIS — L814 Other melanin hyperpigmentation: Secondary | ICD-10-CM | POA: Diagnosis not present

## 2023-01-09 DIAGNOSIS — G4700 Insomnia, unspecified: Secondary | ICD-10-CM | POA: Diagnosis not present

## 2023-01-09 DIAGNOSIS — M792 Neuralgia and neuritis, unspecified: Secondary | ICD-10-CM | POA: Diagnosis not present

## 2023-01-09 DIAGNOSIS — F411 Generalized anxiety disorder: Secondary | ICD-10-CM | POA: Diagnosis not present

## 2023-01-14 DIAGNOSIS — H04123 Dry eye syndrome of bilateral lacrimal glands: Secondary | ICD-10-CM | POA: Diagnosis not present

## 2023-03-16 DIAGNOSIS — Z03818 Encounter for observation for suspected exposure to other biological agents ruled out: Secondary | ICD-10-CM | POA: Diagnosis not present

## 2023-03-16 DIAGNOSIS — J0300 Acute streptococcal tonsillitis, unspecified: Secondary | ICD-10-CM | POA: Diagnosis not present

## 2023-03-16 DIAGNOSIS — R509 Fever, unspecified: Secondary | ICD-10-CM | POA: Diagnosis not present

## 2023-03-16 DIAGNOSIS — R051 Acute cough: Secondary | ICD-10-CM | POA: Diagnosis not present

## 2023-04-01 NOTE — Progress Notes (Unsigned)
Cardiology Office Note Date:  04/01/2023  Patient ID:  [name], [name] Feb 27, 1956, MRN [id number] PCP:  Joycelyn Rua, MD  Cardiologist:  Dr. Graciela Husbands    Chief Complaint:  hospital follow up  History of Present Illness: [name] is a 67 y.o. female with history of SVT, anxiety/depression, dysautonomia    She comes in today to be seen for Dr. Graciela Husbands, last seen by him  Nov 2018.  At that time his assessment was: Palpitations Intermittent hypotension Dysautonomia Mild MR Was doing well overall, planned to update echo in 2 years  She was hospitalized 6/20-21 with c/o CP, near syncope.  H&P noted worsening with deep breath and chest movement, no relieving factors until Nitro given by EMS She was discharged with negative Trop, no recurrent symptoms, TTE noted LVEF 60-65%, no WMA, mod MR, near normal carotid US to follow up with [place].  01/08/20 LABS HS Trop <2, 3, 3 K+ 3.7 BUN/Creat 7/0.8 WBC 4.8 H/H 14/44 Plts 401 TSH 1.882  I saw her Aug 2021  She has been quite well the last couple years, mentions post menopausal has greatly improved her dysautonomia symptoms and will only occasionally have a weak spell.  She has not fainted in quite a long time. About 2 weeks ago she started noticing she was having her PVCs, and her HR was widely fluctuating.  She swims regularly for exercise and does a lot of gardening and noted that even a walk around the pool her HR was shooting up to the 120's or so. Unrelated to this he was feeling chest pressure that was a squeezing sensation seemed like something was pressing on her front to back and back to front as well, (no ripping, tearing, not radiating but squeezing).  This could occur at rest and wax/wane, while having it , did seem to feel worse when she started walking around, though exertional not a particular provoker, no other associated symptoms No change to her exertional capacity.  No change in medicines, life events or otherwise of late.   The week prior to all of this starting she had been helping her husband inhis shop, very hot, and physical work without difficulty. She hs Inderal 40mg  PRN, she reports the same Rx for many years, has not used it for years but historically when her HR >130 she would. Planned for monitor ischemic eval Propranolol dose reduced  Stress myoview was low risk Monitor noted unexpected NSVTs Symptoms -fluttering >> sinus @ 86/ on another occasion, non sustained atrial tach 5 b; isolated infrequent PACs    Had a tele visit with A. Tillery 04/03/20, doing very well on lower dose of propranolol, occ hypotension to the 80's, usually higher Re-enforced hydration, sodium No changes made Pt requested visit with Dr. Graciela Husbands 6-12 mo  No visits since this   TODAY She has been quite well She has a long hx of positional dizziness and syncope.  This continues but is much better then it had been years ago Tends to be worse when she is out in the garden and has been bent over for longer periods of time and stand up Or when 1st up out of bed in the morning. She has been doing better with oral hydration then she has in the past She is active exercises in her pool, has grandchildren, enjoys keeping up with them The 65 year old (and her parents) live with her currently.  Reports good exertional capacity No difficulties with her ADLs She has  some palpitations, mostly aware of PVCs, these are not particularly any worse then they have been over the years No sustained palpitations/racing.  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 Rise Paganini attempt   COLONOSCOPY     nasal septal surgery  1985   OPEN REDUCTION INTERNAL FIXATION (ORIF) DISTAL RADIAL FRACTURE Left 11/06/2014   Procedure: OPEN REDUCTION INTERNAL FIXATION (ORIF) LEFT DISTAL RADIAL FRACTURE;  Surgeon: Mack Hook, MD;  Location: [facility];  Service: Orthopedics;  Laterality: Left;  ANESTHESIA:  GENERAL, PRE/OP BLOCK   right hand surgery  1980   rt thumb fx   TUBAL LIGATION     bilateral    Current Outpatient Medications  Medication Sig Dispense Refill   FLUoxetine (PROZAC) 20 MG capsule Take 20 mg by mouth daily.     gabapentin (NEURONTIN) 800 MG tablet Take 1,600 mg by mouth at bedtime.      ibuprofen (ADVIL) 200 MG tablet Take 400-600 mg by mouth every 6 (six) hours as needed for headache (pain).     LORazepam (ATIVAN) 0.5 MG tablet Take 0.5 mg by mouth daily as needed (muscle cramps).     propranolol (INDERAL) 40 MG tablet TAKE ONE TABLET BY MOUTH AS NEEDED IF HEART RATE IS 110-120 FOR MORE THAN 10-20 MINUTES 30 tablet 0   zolpidem (AMBIEN) 10 MG tablet Take 10 mg by mouth at bedtime as needed for sleep.      No current facility-administered medications for this visit.    Allergies:   Erythromycin base   Social History:  The patient  reports that she has never smoked. She has never used smokeless tobacco. She reports current alcohol use. She reports that she does not use drugs.   Family History:  The patient's family history includes Atrial fibrillation in her father; Hyperlipidemia in her father; Hypertension (age of onset: 77) in her mother; Hypertension (age of  onset: 63) in her father.  ROS:  Please see the history of present illness.  All other systems are reviewed and otherwise negative.   PHYSICAL EXAM: VS:  There were no vitals taken for this visit. BMI: There is no height or weight on file to calculate BMI. Well nourished, well developed, in no acute distress  HEENT: normocephalic, atraumatic  Neck: no JVD, carotid bruits or masses Cardiac:  RRR; no significant murmurs, no rubs, or gallops Lungs: CTA b/l, no wheezing, rhonchi or rales  Abd: soft, nontender MS: no deformity or atrophy Ext: no edema  Skin: warm and dry, no rash Neuro:  No gross deficits appreciated  Psych: euthymic mood, full affect  EKG:  done today and reviewed by myself SR 80bpm  Aug 2017: monitor Findings HR  avg 77  Min 54-Max 146  PVCs Rare, less than 1%   PACs 2.8% SVT Nonsustained 22 episodes; fastest 197 bpm for 5 beats; longest for 11.5 sec at 158 bpm  VT Nonsustained 2 episodes; fastest 261 bpm for 10 beats;     Symptoms -fluttering >> sinus @ 86/ on another occasion, non sustained atrial tach 5 b; isolated infrequent PACs  Triggered without symptoms > Nonsustained atrial tach                Also with no arrhythmia    Suprisingly an episode of VT    Recommendations The symptoms and triggered event s are often with no arrhythmia, isolated PACs and short nonsustained atrial tach   The VT nonsustained was unexpected and may be a clue to an underlying issue Will have echo reviewed  Would get cardiac CTA to exclude CAD   02/13/20: stress myoview Nuclear stress EF: 71%. Blood pressure demonstrated a normal response to exercise. There was no ST segment deviation noted during stress. The study is normal. This is a low risk study. The left ventricular ejection fraction is hyperdynamic (>65%).   Normal resting and stress perfusion. No ischemia or infarction EF EF 71% normal ECG response to exercise Patient achieved only 89% of PMHR  01/09/2020:  Carotid US Summary:  Right Carotid: The extracranial vessels were near-normal with only minimal  wall thickening or plaque.   Left Carotid: The extracranial vessels were near-normal with only minimal  wall thickening or plaque.   Vertebrals:  Bilateral vertebral arteries demonstrate antegrade flow.  Subclavians: Normal flow hemodynamics were seen in bilateral subclavian arteries.   01/09/2020: TTE IMPRESSIONS  1. Normal LV function; trace AI; moderate MR.   2. Left ventricular ejection fraction, by estimation, is 60 to 65%. The  left ventricle has normal function. The left ventricle has no regional  wall motion abnormalities. Left ventricular diastolic parameters were  normal.   3. Right ventricular systolic function is normal. The right ventricular  size is normal. There is normal pulmonary artery systolic pressure.   4. The mitral valve is normal in structure. Moderate mitral valve  regurgitation. No evidence of mitral stenosis.   5. The aortic valve has an indeterminant number of cusps. Aortic valve  regurgitation is trivial. No aortic stenosis is present.   6. The inferior vena cava is normal in size with greater than 50%  respiratory variability, suggesting right atrial pressure of 3 mmHg.   Recent Labs: No results found for requested labs within last 365 days.  No results found for requested labs within last 365 days.   CrCl cannot be calculated (Patient's most recent lab result is older than the maximum 21 days allowed.).   Wt Readings from Last 3 Encounters:  04/03/20 132 lb (59.9 kg)  03/08/20 132 lb (59.9 kg)  02/13/20 137 lb (62.1 kg)     Other studies reviewed: Additional studies/records reviewed today include: summarized above  ASSESSMENT AND PLAN:  1. Dysautonomia     Seems fairly well controlled     Positional symptoms only and better then in the past  2. SVT 3. PVCs Infrequent palpitations       4. VHD     Mod MR     No murmur on exam     No  symptoms to suggest change    Advised  mor regular follow up She is agreeable    Disposition: back in a year, sooner if needed     Current medicines are reviewed at length with the patient today.  The patient did not have any concerns regarding medicines.  Norma Fredrickson, PA-C 04/01/2023 4:35 PM     [facility] [address] [place] [address] [phone] (office)  [phone] (fax)

## 2023-04-02 ENCOUNTER — Ambulatory Visit: Payer: Medicare HMO | Attending: Physician Assistant | Admitting: Physician Assistant

## 2023-04-02 ENCOUNTER — Encounter: Payer: Self-pay | Admitting: Physician Assistant

## 2023-04-02 VITALS — BP 108/70 | HR 81 | Ht 64.0 in | Wt 150.2 lb

## 2023-04-02 DIAGNOSIS — I340 Nonrheumatic mitral (valve) insufficiency: Secondary | ICD-10-CM | POA: Diagnosis not present

## 2023-04-02 DIAGNOSIS — G901 Familial dysautonomia [Riley-Day]: Secondary | ICD-10-CM

## 2023-04-02 DIAGNOSIS — R002 Palpitations: Secondary | ICD-10-CM

## 2023-04-02 DIAGNOSIS — I471 Supraventricular tachycardia, unspecified: Secondary | ICD-10-CM

## 2023-04-02 MED ORDER — PROPRANOLOL HCL 40 MG PO TABS: 40.0000 mg | ORAL_TABLET | Freq: Every day | ORAL | 1 refills | Status: DC | PRN

## 2023-04-02 NOTE — Patient Instructions (Signed)
Medication Instructions:   Your physician recommends that you continue on your current medications as directed. Please refer to the Current Medication list given to you today.   *If you need a refill on your cardiac medications before your next appointment, please call your pharmacy*   Lab Work: NONE ORDERED  TODAY   If you have labs (blood work) drawn today and your tests are completely normal, you will receive your results only by: MyChart Message (if you have MyChart) OR A paper copy in the mail If you have any lab test that is abnormal or we need to change your treatment, we will call you to review the results.      Follow-Up: At [facility], you and your health needs are our priority.  As part of our continuing mission to provide you with exceptional heart care, we have created designated Provider Care Teams.  These Care Teams include your primary Cardiologist (physician) and Advanced Practice Providers (APPs -  Physician Assistants and Nurse Practitioners) who all work together to provide you with the care you need, when you need it.  We recommend signing up for the patient portal called "MyChart".  Sign up information is provided on this After Visit Summary.  MyChart is used to connect with patients for Virtual Visits (Telemedicine).  Patients are able to view lab/test results, encounter notes, upcoming appointments, etc.  Non-urgent messages can be sent to your provider as well.   To learn more about what you can do with MyChart, go to [email or web address].    Your next appointment:  [facility] ONLY ASAP  LAST OV 2018  ( CONTACT CASSIE HALL FOR EP ISSUES   Other Instructions

## 2023-07-23 DIAGNOSIS — Z124 Encounter for screening for malignant neoplasm of cervix: Secondary | ICD-10-CM | POA: Diagnosis not present

## 2023-07-23 DIAGNOSIS — Z780 Asymptomatic menopausal state: Secondary | ICD-10-CM | POA: Diagnosis not present

## 2023-07-23 DIAGNOSIS — E785 Hyperlipidemia, unspecified: Secondary | ICD-10-CM | POA: Diagnosis not present

## 2023-07-23 DIAGNOSIS — G4700 Insomnia, unspecified: Secondary | ICD-10-CM | POA: Diagnosis not present

## 2023-07-23 DIAGNOSIS — Z1211 Encounter for screening for malignant neoplasm of colon: Secondary | ICD-10-CM | POA: Diagnosis not present

## 2023-07-23 DIAGNOSIS — F411 Generalized anxiety disorder: Secondary | ICD-10-CM | POA: Diagnosis not present

## 2023-07-23 DIAGNOSIS — Z1239 Encounter for other screening for malignant neoplasm of breast: Secondary | ICD-10-CM | POA: Diagnosis not present

## 2023-07-23 DIAGNOSIS — Z0000 Encounter for general adult medical examination without abnormal findings: Secondary | ICD-10-CM | POA: Diagnosis not present

## 2023-07-24 ENCOUNTER — Other Ambulatory Visit (HOSPITAL_BASED_OUTPATIENT_CLINIC_OR_DEPARTMENT_OTHER): Payer: Self-pay | Admitting: Family Medicine

## 2023-07-24 DIAGNOSIS — Z780 Asymptomatic menopausal state: Secondary | ICD-10-CM

## 2023-07-24 DIAGNOSIS — Z1231 Encounter for screening mammogram for malignant neoplasm of breast: Secondary | ICD-10-CM

## 2023-08-27 ENCOUNTER — Other Ambulatory Visit (HOSPITAL_BASED_OUTPATIENT_CLINIC_OR_DEPARTMENT_OTHER): Payer: Medicare HMO

## 2023-08-27 ENCOUNTER — Ambulatory Visit (HOSPITAL_BASED_OUTPATIENT_CLINIC_OR_DEPARTMENT_OTHER): Payer: Medicare HMO | Admitting: Radiology

## 2023-08-31 ENCOUNTER — Ambulatory Visit (HOSPITAL_BASED_OUTPATIENT_CLINIC_OR_DEPARTMENT_OTHER)
Admission: RE | Admit: 2023-08-31 | Discharge: 2023-08-31 | Disposition: A | Discharge status: Home / Self Care | Payer: Medicare HMO | Source: Ambulatory Visit | Attending: Family Medicine | Admitting: Family Medicine

## 2023-08-31 ENCOUNTER — Encounter (HOSPITAL_BASED_OUTPATIENT_CLINIC_OR_DEPARTMENT_OTHER): Payer: Self-pay | Admitting: Radiology

## 2023-08-31 DIAGNOSIS — Z1231 Encounter for screening mammogram for malignant neoplasm of breast: Secondary | ICD-10-CM | POA: Diagnosis not present

## 2023-08-31 DIAGNOSIS — M85831 Other specified disorders of bone density and structure, right forearm: Secondary | ICD-10-CM | POA: Diagnosis not present

## 2023-08-31 DIAGNOSIS — Z780 Asymptomatic menopausal state: Secondary | ICD-10-CM | POA: Diagnosis not present

## 2023-09-14 DIAGNOSIS — H21233 Degeneration of iris (pigmentary), bilateral: Secondary | ICD-10-CM | POA: Diagnosis not present

## 2023-09-14 DIAGNOSIS — H40013 Open angle with borderline findings, low risk, bilateral: Secondary | ICD-10-CM | POA: Diagnosis not present

## 2023-09-14 DIAGNOSIS — H53143 Visual discomfort, bilateral: Secondary | ICD-10-CM | POA: Diagnosis not present

## 2023-09-14 DIAGNOSIS — H04123 Dry eye syndrome of bilateral lacrimal glands: Secondary | ICD-10-CM | POA: Diagnosis not present

## 2023-10-05 ENCOUNTER — Other Ambulatory Visit: Payer: Self-pay

## 2023-10-05 ENCOUNTER — Encounter (HOSPITAL_COMMUNITY): Payer: Self-pay

## 2023-10-05 ENCOUNTER — Emergency Department (HOSPITAL_COMMUNITY)

## 2023-10-05 ENCOUNTER — Emergency Department (HOSPITAL_COMMUNITY)
Admission: EM | Admit: 2023-10-05 | Discharge: 2023-10-05 | Disposition: A | Discharge status: Home / Self Care | Attending: Student | Admitting: Student

## 2023-10-05 DIAGNOSIS — R0789 Other chest pain: Secondary | ICD-10-CM | POA: Insufficient documentation

## 2023-10-05 DIAGNOSIS — I10 Essential (primary) hypertension: Secondary | ICD-10-CM | POA: Diagnosis not present

## 2023-10-05 DIAGNOSIS — R079 Chest pain, unspecified: Secondary | ICD-10-CM | POA: Diagnosis not present

## 2023-10-05 DIAGNOSIS — R0902 Hypoxemia: Secondary | ICD-10-CM | POA: Diagnosis not present

## 2023-10-05 LAB — CBC
HCT: 44.5 % (ref 36.0–46.0)
Hemoglobin: 15 g/dL (ref 12.0–15.0)
MCH: 33.3 pg (ref 26.0–34.0)
MCHC: 33.7 g/dL (ref 30.0–36.0)
MCV: 98.9 fL (ref 80.0–100.0)
Platelets: 378 10*3/uL (ref 150–400)
RBC: 4.5 MIL/uL (ref 3.87–5.11)
RDW: 13.2 % (ref 11.5–15.5)
WBC: 4.7 10*3/uL (ref 4.0–10.5)
nRBC: 0 % (ref 0.0–0.2)

## 2023-10-05 LAB — BASIC METABOLIC PANEL
Anion gap: 12 (ref 5–15)
BUN: 8 mg/dL (ref 8–23)
CO2: 21 mmol/L — ABNORMAL LOW (ref 22–32)
Calcium: 9.7 mg/dL (ref 8.9–10.3)
Chloride: 105 mmol/L (ref 98–111)
Creatinine, Ser: 0.68 mg/dL (ref 0.44–1.00)
GFR, Estimated: >60 mL/min (ref >60)
Glucose, Bld: 103 mg/dL — ABNORMAL HIGH (ref 70–99)
Potassium: 4.5 mmol/L (ref 3.5–5.1)
Sodium: 138 mmol/L (ref 135–145)

## 2023-10-05 LAB — TROPONIN I (HIGH SENSITIVITY)
Troponin I (High Sensitivity): 3 ng/L (ref <18)
Troponin I (High Sensitivity): 4 ng/L (ref <18)

## 2023-10-05 NOTE — ED Notes (Signed)
 Pt husband aggressively requesting to speak to someone in charge and demanding her to be roomed d/t wait time and feeling neglected. Charge nurse made aware.

## 2023-10-05 NOTE — ED Provider Notes (Signed)
 [place] EMERGENCY DEPARTMENT AT [facility] Provider Note   CSN: [id number] Arrival date & time: 10/05/23  1359     History   Chief Complaint  Patient presents with   Chest Pain    [name] is a 68 y.o. female with a PMHx as below who presents for chest pain.  Wife endorsed some chest pain last night to husband prior to bed, which she does not remember. However when she awoke this morning she had some chest wall tenderness over the left anterior wall.  She has never had pain like this before, so she presented to the ED for evaluation.  Daughter gave her 4 baby aspirin, EMS gave her 1 sublingual nitroglycerin.  Pain subsided with the symptoms.  Pain is not positional.  It does not worsen with inspiration.  She denies cough.  She denies emesis.  She denies recent sick symptoms.  history of SVT, anxiety/depression, dysautonomia    Chest Pain    Home Medications Prior to Admission medications   Medication Sig Start Date End Date Taking? Authorizing Provider  FLUoxetine (PROZAC) 20 MG capsule Take 20 mg by mouth daily.    [provider]  gabapentin (NEURONTIN) 800 MG tablet Take 1,600 mg by mouth at bedtime.  08/12/11   [provider]  ibuprofen (ADVIL) 200 MG tablet Take 400-600 mg by mouth every 6 (six) hours as needed for headache (pain).    [provider]  LORazepam (ATIVAN) 0.5 MG tablet Take 0.5 mg by mouth daily as needed (muscle cramps).    [provider]  propranolol (INDERAL) 40 MG tablet Take 1 tablet (40 mg total) by mouth daily as needed. 04/02/23   Sheilah Pigeon, PA-C  zolpidem (AMBIEN) 10 MG tablet Take 10 mg by mouth at bedtime as needed for sleep.  07/25/11   [provider]      Allergies    Erythromycin base    Review of Systems   Review of Systems  Cardiovascular:  Positive for chest pain.    Physical Exam Updated Vital Signs BP (!) 152/76 (BP Location: Left Arm)   Pulse 68   Temp 98.4 F  (36.9 C)   Resp 20   Ht 5\' 3"  (1.6 m)   Wt 68 kg   SpO2 99%   BMI 26.57 kg/m  Physical Exam Vitals and nursing note reviewed.  Constitutional:      General: She is not in acute distress.    Appearance: Normal appearance. She is not ill-appearing.  HENT:     Head: Normocephalic and atraumatic.  Cardiovascular:     Rate and Rhythm: Normal rate and regular rhythm.     Heart sounds: Normal heart sounds.  Pulmonary:     Effort: Pulmonary effort is normal.     Breath sounds: Normal breath sounds. No decreased breath sounds, wheezing, rhonchi or rales.  Chest:     Comments: Tenderness to lower sternal and left inframammary fold palpation Abdominal:     Palpations: Abdomen is soft.     Tenderness: There is no abdominal tenderness. There is no right CVA tenderness or left CVA tenderness.  Skin:    General: Skin is warm and dry.     Capillary Refill: Capillary refill takes less than 2 seconds.  Neurological:     Mental Status: She is alert.  Psychiatric:        Behavior: Behavior is cooperative.    ED Results / Procedures / Treatments   Labs (  all labs ordered are listed, but only abnormal results are displayed) Labs Reviewed  BASIC METABOLIC PANEL - Abnormal; Notable for the following components:      Result Value   CO2 21 (*)    Glucose, Bld 103 (*)    All other components within normal limits  CBC  TROPONIN I (HIGH SENSITIVITY)  TROPONIN I (HIGH SENSITIVITY)    EKG EKG Interpretation Date/Time:  Monday October 05 2023 14:04:30 EDT Ventricular Rate:  67 PR Interval:  152 QRS Duration:  80 QT Interval:  436 QTC Calculation: 460 R Axis:   58  Text Interpretation: Sinus rhythm with Premature atrial complexes Otherwise normal ECG When compared with ECG of 02-Apr-2023 14:07, PREVIOUS ECG IS PRESENT No significant change since last tracing Confirmed by Vanetta Mulders [phone]) on 10/05/2023 2:12:09 PM  Radiology DG Chest 2 View Result Date: 10/05/2023 CLINICAL DATA:   Chest pain. EXAM: CHEST - 2 VIEW COMPARISON:  Chest radiograph dated 01/08/2020. FINDINGS: No focal consolidation, pleural effusion, or pneumothorax. The cardiac silhouette is within normal limits. No acute osseous pathology. IMPRESSION: No active cardiopulmonary disease. Electronically Signed   By: Elgie Collard M.D.   On: 10/05/2023 16:11    Procedures Procedures   Medications Ordered in ED Medications - No data to display  ED Course/ Medical Decision Making/ A&P            HEART Score: 3                    Medical Decision Making Amount and/or Complexity of Data Reviewed Labs: ordered. Radiology: ordered.   68 year old female with a PMHx of SVT, anxiety/depression, dysautonomia who presents for chest pain x1 day.  Initial differential includes ACS, PE, MSK,, pneumonia, pneumothorax, myocarditis, pericarditis, pleuritis, GERD. Initial troponin 3 with 2-hour reflex 4.  EKG is notable for sinus rhythm with PACs.  Patient has a documented history of PACs back to 2021.  EKG is similar to prior.  Appropriate intervals.  This EKG does not show STEMI.  Given troponin and EKG, low suspicion for ACS.  Heart score 3.  Wells criteria 0 points; low suspicion for pulmonary embolism.  Pain is not pleuritic and patient has had no recent illnesses, thus low suspicion for myocarditis or pericarditis.  Pain is not associated with meals or positioning, thus lower suspicion for GERD.  Labs are unremarkable with no leukocytosis and stable hemoglobin.  No electrolyte or metabolic derangements, appropriate glucose, renal function within normal limits.  CXR shows no focal consolidation concerning for pneumonia, no pleural effusion, no pneumothorax.  Given that patient's chest pain is reproducible on my palpation, suspect MSK.  Patient has no contraindications for ibuprofen, advised initiation of ibuprofen and follow-up with PCP.  Patient and husband at bedside are in understanding and agreement with plan.  Patient  was discharged in hemodynamically stable condition.  Final Clinical Impression(s) / ED Diagnoses Final diagnoses:  Chest wall pain    Rx / DC Orders ED Discharge Orders     None      Renella Cunas, PGY 2 Emergency medicine   Renella Cunas, MD 10/05/23 2222    Glendora Score, MD 10/06/23 [phone]

## 2023-10-05 NOTE — Discharge Instructions (Signed)
 You are seen in the ED today for chest pain.  Your labs were reassuring.  Troponins were negative.  EKG was similar to prior.  Chest x-ray was unremarkable.  You should take ibuprofen 800 mg every 8 hours for chest wall pain.  Please follow-up with your primary care doctor.    If you have worsening chest pain, shortness of breath, inability to breathe, please return to the ED for evaluation.

## 2023-10-05 NOTE — ED Provider Notes (Signed)
 EMS triage Medical screening  Patient awoke with substernal chest pain this morning at 730.  She did go to bed feeling very hot and diaphoretic but had no pain at that time.  EMS gave her aspirin and 3 nitro's which improved the pain but did not completely resolved.  Pain is nonradiating.  Patient is never had pain like this before.  Patient has a history of some cardiac problems but no known cardiac coronary artery disease.  Patient's Mitral valve prolapse moderate mitral regurg send supraventricular tachycardia.  No nausea vomiting no shortness of breath.  Temp is 98.3 pulse 70 respirations 14 blood pressure 147/76 oxygen saturation 100%.  Patient nontoxic no acute distress.  Heart regular rate and rhythm lungs are clear to auscultation bilaterally.  No leg swelling.  Patient will get CBC basic metabolic panel troponins x 2 and chest x-ray.  Patient's EKG shows no evidence of any acute cardiac event.   Vanetta Mulders, MD 10/05/23 [phone]

## 2023-10-05 NOTE — ED Triage Notes (Signed)
 Pt BIB EMS from home for substernal CP that started this morning. Hx of PVC's. EMs gave 324 mg of aspirin and took 3 nitros. Pt has had cough since October.

## 2023-10-26 DIAGNOSIS — H53143 Visual discomfort, bilateral: Secondary | ICD-10-CM | POA: Diagnosis not present

## 2023-10-26 DIAGNOSIS — H524 Presbyopia: Secondary | ICD-10-CM | POA: Diagnosis not present

## 2023-10-26 DIAGNOSIS — H04123 Dry eye syndrome of bilateral lacrimal glands: Secondary | ICD-10-CM | POA: Diagnosis not present

## 2023-11-09 ENCOUNTER — Other Ambulatory Visit: Payer: Self-pay | Admitting: Physician Assistant

## 2023-11-11 ENCOUNTER — Other Ambulatory Visit: Payer: Self-pay

## 2023-11-11 MED ORDER — PROPRANOLOL HCL 40 MG PO TABS: 40.0000 mg | ORAL_TABLET | Freq: Every day | ORAL | 2 refills | Status: AC | PRN

## 2023-12-18 DIAGNOSIS — Z6827 Body mass index (BMI) 27.0-27.9, adult: Secondary | ICD-10-CM | POA: Diagnosis not present

## 2023-12-18 DIAGNOSIS — N390 Urinary tract infection, site not specified: Secondary | ICD-10-CM | POA: Diagnosis not present

## 2023-12-19 ENCOUNTER — Other Ambulatory Visit: Payer: Self-pay

## 2023-12-19 ENCOUNTER — Emergency Department (HOSPITAL_BASED_OUTPATIENT_CLINIC_OR_DEPARTMENT_OTHER)

## 2023-12-19 ENCOUNTER — Emergency Department (HOSPITAL_BASED_OUTPATIENT_CLINIC_OR_DEPARTMENT_OTHER)
Admission: EM | Admit: 2023-12-19 | Discharge: 2023-12-19 | Disposition: A | Discharge status: Home / Self Care | Attending: Emergency Medicine | Admitting: Emergency Medicine

## 2023-12-19 DIAGNOSIS — N3000 Acute cystitis without hematuria: Secondary | ICD-10-CM | POA: Insufficient documentation

## 2023-12-19 DIAGNOSIS — R109 Unspecified abdominal pain: Secondary | ICD-10-CM | POA: Diagnosis not present

## 2023-12-19 DIAGNOSIS — E871 Hypo-osmolality and hyponatremia: Secondary | ICD-10-CM | POA: Diagnosis not present

## 2023-12-19 DIAGNOSIS — K7689 Other specified diseases of liver: Secondary | ICD-10-CM | POA: Diagnosis not present

## 2023-12-19 DIAGNOSIS — R339 Retention of urine, unspecified: Secondary | ICD-10-CM | POA: Diagnosis present

## 2023-12-19 LAB — CBC WITH DIFFERENTIAL/PLATELET
Abs Immature Granulocytes: 0.01 10*3/uL (ref 0.00–0.07)
Basophils Absolute: 0.1 10*3/uL (ref 0.0–0.1)
Basophils Relative: 1 %
Eosinophils Absolute: 0.2 10*3/uL (ref 0.0–0.5)
Eosinophils Relative: 3 %
HCT: 37.6 % (ref 36.0–46.0)
Hemoglobin: 13 g/dL (ref 12.0–15.0)
Immature Granulocytes: 0 %
Lymphocytes Relative: 36 %
Lymphs Abs: 2.1 10*3/uL (ref 0.7–4.0)
MCH: 33.6 pg (ref 26.0–34.0)
MCHC: 34.6 g/dL (ref 30.0–36.0)
MCV: 97.2 fL (ref 80.0–100.0)
Monocytes Absolute: 0.9 10*3/uL (ref 0.1–1.0)
Monocytes Relative: 15 %
Neutro Abs: 2.6 10*3/uL (ref 1.7–7.7)
Neutrophils Relative %: 45 %
Platelets: 352 10*3/uL (ref 150–400)
RBC: 3.87 MIL/uL (ref 3.87–5.11)
RDW: 12.9 % (ref 11.5–15.5)
WBC: 5.8 10*3/uL (ref 4.0–10.5)
nRBC: 0 % (ref 0.0–0.2)

## 2023-12-19 LAB — URINALYSIS, ROUTINE W REFLEX MICROSCOPIC
Bilirubin Urine: NEGATIVE
Glucose, UA: NEGATIVE mg/dL
Hgb urine dipstick: NEGATIVE
Ketones, ur: NEGATIVE mg/dL
Nitrite: NEGATIVE
Protein, ur: NEGATIVE mg/dL
Specific Gravity, Urine: 1.01 (ref 1.005–1.030)
pH: 5.5 (ref 5.0–8.0)

## 2023-12-19 LAB — COMPREHENSIVE METABOLIC PANEL WITH GFR
ALT: 20 U/L (ref 0–44)
AST: 28 U/L (ref 15–41)
Albumin: 4.1 g/dL (ref 3.5–5.0)
Alkaline Phosphatase: 88 U/L (ref 38–126)
Anion gap: 13 (ref 5–15)
BUN: 8 mg/dL (ref 8–23)
CO2: 19 mmol/L — ABNORMAL LOW (ref 22–32)
Calcium: 8.8 mg/dL — ABNORMAL LOW (ref 8.9–10.3)
Chloride: 98 mmol/L (ref 98–111)
Creatinine, Ser: 0.8 mg/dL (ref 0.44–1.00)
GFR, Estimated: >60 mL/min (ref >60)
Glucose, Bld: 99 mg/dL (ref 70–99)
Potassium: 3.9 mmol/L (ref 3.5–5.1)
Sodium: 130 mmol/L — ABNORMAL LOW (ref 135–145)
Total Bilirubin: 0.3 mg/dL (ref 0.0–1.2)
Total Protein: 6.4 g/dL — ABNORMAL LOW (ref 6.5–8.1)

## 2023-12-19 LAB — URINALYSIS, MICROSCOPIC (REFLEX)

## 2023-12-19 MED ORDER — MORPHINE SULFATE (PF) 2 MG/ML IV SOLN
2.0000 mg | Freq: Once | INTRAVENOUS | Status: AC
  Administered 2023-12-19: 2 mg via INTRAVENOUS
  Filled 2023-12-19: qty 1

## 2023-12-19 MED ORDER — ONDANSETRON HCL 4 MG/2ML IJ SOLN
4.0000 mg | Freq: Once | INTRAMUSCULAR | Status: AC
  Administered 2023-12-19: 4 mg via INTRAVENOUS
  Filled 2023-12-19: qty 2

## 2023-12-19 NOTE — Discharge Instructions (Signed)
 You were seen in the emergency department for concerns of a UTI and back pain.  Your labs and imaging were reassuring with no signs of any kidney stones or kidney infection.  I would continue with your current antibiotics as prescribed.  A urine culture has been collected and the results will be available in about 2 days if any modifications are needed to be made to your antibiotics.  For any concerns or new or worsening symptoms, return to emergency department for further evaluation.

## 2023-12-19 NOTE — ED Notes (Signed)

## 2023-12-19 NOTE — ED Provider Notes (Signed)
  EMERGENCY DEPARTMENT AT [facility] Provider Note   CSN: [id number] Arrival date & time: 12/19/23  1449     History Chief Complaint  Patient presents with   Back Pain   Urinary Retention    [name] is a 68 y.o. female.  Patient with past history significant for dysautonomia, mitral regurgitation, presents the emergency department today with concerns of back pain and urinary retention.  Reports that she was diagnosed with a UTI yesterday by her primary care office and started on antibiotics.  Denies any significant treatment with the antibiotics.  States that starting today, she has had multiple episodes of nausea and vomiting and feels that she is having some left-sided low back pain as well.  No history of kidney stones.  Denies any obvious hematuria.  No recent fever, chills or bodyaches.   Back Pain      Home Medications Prior to Admission medications   Medication Sig Start Date End Date Taking? Authorizing Provider  FLUoxetine  (PROZAC ) 20 MG capsule Take 20 mg by mouth daily.    [provider]  gabapentin  (NEURONTIN ) 800 MG tablet Take 1,600 mg by mouth at bedtime.  08/12/11   [provider]  ibuprofen  (ADVIL ) 200 MG tablet Take 400-600 mg by mouth every 6 (six) hours as needed for headache (pain).    [provider]  LORazepam  (ATIVAN ) 0.5 MG tablet Take 0.5 mg by mouth daily as needed (muscle cramps).    [provider]  propranolol  (INDERAL ) 40 MG tablet Take 1 tablet (40 mg total) by mouth daily as needed. 11/11/23   Verona Goodwill, MD  zolpidem  (AMBIEN ) 10 MG tablet Take 10 mg by mouth at bedtime as needed for sleep.  07/25/11   [provider]      Allergies    Erythromycin base    Review of Systems   Review of Systems  Musculoskeletal:  Positive for back pain.  All other systems reviewed and are negative.   Physical Exam Updated Vital Signs BP 123/79 (BP Location: Right Arm)   Pulse 73    Temp 98.8 F (37.1 C) (Oral)   Resp 18   Ht 5\' 3"  (1.6 m)   Wt 68 kg   SpO2 96%   BMI 26.56 kg/m  Physical Exam Vitals and nursing note reviewed.  Constitutional:      General: She is not in acute distress.    Appearance: She is well-developed.  HENT:     Head: Normocephalic and atraumatic.  Eyes:     Conjunctiva/sclera: Conjunctivae normal.  Cardiovascular:     Rate and Rhythm: Normal rate and regular rhythm.     Heart sounds: No murmur heard. Pulmonary:     Effort: Pulmonary effort is normal. No respiratory distress.     Breath sounds: Normal breath sounds.  Abdominal:     Palpations: Abdomen is soft.     Tenderness: There is no abdominal tenderness. There is left CVA tenderness. There is no right CVA tenderness.  Musculoskeletal:        General: No swelling.     Cervical back: Neck supple.  Skin:    General: Skin is warm and dry.     Capillary Refill: Capillary refill takes less than 2 seconds.  Neurological:     Mental Status: She is alert.  Psychiatric:        Mood and Affect: Mood normal.     ED Results / Procedures / Treatments   Labs (  all labs ordered are listed, but only abnormal results are displayed) Labs Reviewed  URINALYSIS, ROUTINE W REFLEX MICROSCOPIC - Abnormal; Notable for the following components:      Result Value   Leukocytes,Ua SMALL (*)    All other components within normal limits  COMPREHENSIVE METABOLIC PANEL WITH GFR - Abnormal; Notable for the following components:   Sodium 130 (*)    CO2 19 (*)    Calcium 8.8 (*)    Total Protein 6.4 (*)    All other components within normal limits  URINALYSIS, MICROSCOPIC (REFLEX) - Abnormal; Notable for the following components:   Bacteria, UA RARE (*)    All other components within normal limits  URINE CULTURE  CBC WITH DIFFERENTIAL/PLATELET    EKG None  Radiology CT Renal Stone Study Result Date: 12/19/2023 CLINICAL DATA:  Left flank pain EXAM: CT ABDOMEN AND PELVIS WITHOUT CONTRAST  TECHNIQUE: Multidetector CT imaging of the abdomen and pelvis was performed following the standard protocol without IV contrast. RADIATION DOSE REDUCTION: This exam was performed according to the departmental dose-optimization program which includes automated exposure control, adjustment of the mA and/or kV according to patient size and/or use of iterative reconstruction technique. COMPARISON:  None Available. FINDINGS: Lower chest: No acute abnormality. Hepatobiliary: There is a 6 cm cyst in the right lobe of the liver. The liver is otherwise within normal limits. Gallbladder and bile ducts are within normal limits. Pancreas: Unremarkable. No pancreatic ductal dilatation or surrounding inflammatory changes. Spleen: Normal in size without focal abnormality. Adrenals/Urinary Tract: Adrenal glands are unremarkable. Kidneys are normal, without renal calculi, focal lesion, or hydronephrosis. Bladder is unremarkable. Stomach/Bowel: Stomach is within normal limits. Appendix appears normal. No evidence of bowel wall thickening, distention, or inflammatory changes. Vascular/Lymphatic: Aortic atherosclerosis. No enlarged abdominal or pelvic lymph nodes. Reproductive: Uterus and bilateral adnexa are unremarkable. Other: No abdominal wall hernia or abnormality. No abdominopelvic ascites. Musculoskeletal: There are degenerative changes of the lower lumbar spine. IMPRESSION: 1. No acute localizing process in the abdomen or pelvis. 2. No evidence for nephrolithiasis or hydronephrosis. 3. Hepatic cyst. 4. Aortic atherosclerosis. Aortic Atherosclerosis (ICD10-I70.0). Electronically Signed   By: Tyron Gallon M.D.   On: 12/19/2023 17:22    Procedures Procedures    Medications Ordered in ED Medications  ondansetron  (ZOFRAN ) injection 4 mg (4 mg Intravenous Given 12/19/23 1614)  morphine  (PF) 2 MG/ML injection 2 mg (2 mg Intravenous Given 12/19/23 1617)    ED Course/ Medical Decision Making/ A&P                                 Medical Decision Making Amount and/or Complexity of Data Reviewed Labs: ordered. Radiology: ordered.  Risk Prescription drug management.   This patient presents to the ED for concern of back pain, urinary retention.  Differential diagnosis includes urolithiasis, pyelonephritis, bowel obstruction, diverticulitis, UTI    Lab Tests:  I Ordered, and personally interpreted labs.  The pertinent results include: CBC with no evident leukocytosis or other abnormalities, CMP with mild hyponatremia 130 CO2 decreased to 19.  Unclear if patient is mildly dehydrated or possibly volume overloaded with her recent increased drinking due to the vomiting that she is experiencing.  Urinalysis consistent with possible infection with rare bacteria and leukocytes seen.  This may be somewhat altered due to patient already having started antibiotic therapy for a suspected UTI.   Imaging Studies ordered:  I ordered imaging studies including  CT renal stone study I independently visualized and interpreted imaging which showed no acute abnormalities I agree with the radiologist interpretation   Medicines ordered and prescription drug management:  I ordered medication including morphine , Zofran  for pain, nausea Reevaluation of the patient after these medicines showed that the patient improved I have reviewed the patients home medicines and have made adjustments as needed   Problem List / ED Course:  Patient presents to the emergency department today with concerns of back pain and urinary discomfort.  Reports that she was diagnosed with a UTI yesterday and started on Keflex.  She states that she has had multiple episodes of nausea and vomiting starting today.  No history of kidney stones.  Denies any recent fever, chills or bodyaches. On exam, patient has mild left-sided CVA tenderness.  Right side unremarkable.  No abdominal tenderness or abnormal abdominal sounds.  Heart lung sounds unremarkable.  Will  proceed with workup including evaluation for possible kidney stones as that is on top differential versus pyelonephritis.  Patient does not appear to be septic.  Based on vitals.  Not meeting SIRS criteria. Workup is reassuring without any obvious abnormalities to explain symptoms.  Urine appears to be somewhat improved although no urine from yesterday compare to.  There is also bacteria and leukocytes present but no nitrates.  Will send off for urine culture.  CT renal negative for any signs of kidney stone or pyelonephritis. Based on findings, advised patient to continue on with her current Keflex prescription.  Discussed return precautions such as worsening symptoms or true urinary retention in which she is unable to urinate for a period of time even with attempted forceful urination.  Patient is agreeable with current treatment plan verbalized understanding return precautions.  Otherwise stable for outpatient follow-up and discharged home.  Final Clinical Impression(s) / ED Diagnoses Final diagnoses:  Acute cystitis without hematuria  Hyponatremia    Rx / DC Orders ED Discharge Orders     None         Concetta Dee, PA-C 12/19/23 1756    Mordecai Applebaum, MD 12/19/23 2141

## 2023-12-19 NOTE — ED Triage Notes (Signed)
 Pt dx with UTI yesterday; she now has back pain and nausea; sts she is "pounding water", but is not urinating her normal amount

## 2023-12-20 LAB — URINE CULTURE
Culture: NO GROWTH
Special Requests: NORMAL

## 2024-03-24 ENCOUNTER — Encounter: Payer: Self-pay | Admitting: Physician Assistant

## 2024-06-08 DIAGNOSIS — G4700 Insomnia, unspecified: Secondary | ICD-10-CM | POA: Diagnosis not present

## 2024-06-08 DIAGNOSIS — Z6827 Body mass index (BMI) 27.0-27.9, adult: Secondary | ICD-10-CM | POA: Diagnosis not present

## 2024-06-08 DIAGNOSIS — F411 Generalized anxiety disorder: Secondary | ICD-10-CM | POA: Diagnosis not present

## 2024-06-08 DIAGNOSIS — M792 Neuralgia and neuritis, unspecified: Secondary | ICD-10-CM | POA: Diagnosis not present
